# Patient Record
Sex: Male | Born: 1970 | Race: White | Hispanic: No | Marital: Married | Smoking: Never smoker
Health system: Southern US, Community
[De-identification: ages and names within clinical notes are randomized; demographics above are authoritative.]

## PROBLEM LIST (undated history)

## (undated) DIAGNOSIS — I10 Essential (primary) hypertension: Secondary | ICD-10-CM

## (undated) DIAGNOSIS — B019 Varicella without complication: Secondary | ICD-10-CM

## (undated) DIAGNOSIS — N059 Unspecified nephritic syndrome with unspecified morphologic changes: Secondary | ICD-10-CM

## (undated) DIAGNOSIS — T7840XA Allergy, unspecified, initial encounter: Secondary | ICD-10-CM

## (undated) DIAGNOSIS — G4733 Obstructive sleep apnea (adult) (pediatric): Secondary | ICD-10-CM

## (undated) DIAGNOSIS — E669 Obesity, unspecified: Secondary | ICD-10-CM

## (undated) HISTORY — DX: Obesity, unspecified: E66.9

## (undated) HISTORY — DX: Varicella without complication: B01.9

## (undated) HISTORY — PX: FINGER NAIL SURGERY: SHX717

## (undated) HISTORY — DX: Unspecified nephritic syndrome with unspecified morphologic changes: N05.9

## (undated) HISTORY — DX: Allergy, unspecified, initial encounter: T78.40XA

## (undated) HISTORY — DX: Essential (primary) hypertension: I10

## (undated) HISTORY — DX: Obstructive sleep apnea (adult) (pediatric): G47.33

---

## 2002-09-16 HISTORY — PX: TONSILLECTOMY AND ADENOIDECTOMY: SHX28

## 2003-09-14 ENCOUNTER — Other Ambulatory Visit: Payer: Self-pay

## 2004-10-13 ENCOUNTER — Emergency Department: Payer: Self-pay | Admitting: Unknown Physician Specialty

## 2005-09-16 HISTORY — PX: MEDIAL COLLATERAL LIGAMENT AND LATERAL COLLATERAL LIGAMENT REPAIR, KNEE: SHX2017

## 2005-12-22 ENCOUNTER — Emergency Department: Payer: Self-pay | Admitting: Emergency Medicine

## 2005-12-25 ENCOUNTER — Ambulatory Visit: Payer: Self-pay | Admitting: Orthopaedic Surgery

## 2006-03-11 ENCOUNTER — Other Ambulatory Visit: Payer: Self-pay

## 2006-03-17 ENCOUNTER — Ambulatory Visit: Payer: Self-pay | Admitting: Orthopaedic Surgery

## 2006-03-18 ENCOUNTER — Other Ambulatory Visit: Payer: Self-pay

## 2011-10-29 ENCOUNTER — Ambulatory Visit: Payer: Self-pay | Admitting: Otolaryngology

## 2011-11-15 ENCOUNTER — Ambulatory Visit: Payer: Self-pay | Admitting: Otolaryngology

## 2012-05-25 ENCOUNTER — Ambulatory Visit (INDEPENDENT_AMBULATORY_CARE_PROVIDER_SITE_OTHER): Payer: PRIVATE HEALTH INSURANCE | Admitting: Internal Medicine

## 2012-05-25 ENCOUNTER — Encounter: Payer: Self-pay | Admitting: Internal Medicine

## 2012-05-25 VITALS — BP 130/82 | HR 70 | Temp 98.1°F | Resp 18 | Ht 69.5 in | Wt 353.5 lb

## 2012-05-25 DIAGNOSIS — M25511 Pain in right shoulder: Secondary | ICD-10-CM

## 2012-05-25 DIAGNOSIS — M25519 Pain in unspecified shoulder: Secondary | ICD-10-CM

## 2012-05-25 DIAGNOSIS — G4733 Obstructive sleep apnea (adult) (pediatric): Secondary | ICD-10-CM

## 2012-05-25 DIAGNOSIS — G8929 Other chronic pain: Secondary | ICD-10-CM

## 2012-05-25 DIAGNOSIS — E669 Obesity, unspecified: Secondary | ICD-10-CM

## 2012-05-25 NOTE — Progress Notes (Signed)
Patient ID: Stanley Fernandez, male   DOB: 10-Nov-1970, 41 y.o.   MRN: [id number]  Patient Active Problem List  Diagnosis  . Sleep apnea, obstructive  . Obesity (BMI 30-39.9)  . Chronic right shoulder pain    Subjective:  CC:   Chief Complaint  Patient presents with  . Establish Care    HPI:   Stanley Fernandez is a 41 y.o. male who presents as a new patient to establish primary care with the chief complaint of weight gain.  Weighed 170 lbs in high school,  Has gained weight  Steadily since high school.  No history of diabetes .  2)  right shoulder/clavicular pain ,  Episodic, occurring for a few weeks at a time.  Aggravated by activities including softball.  No prior evaluation.  worse in the last 3 years,  Takes tylenol or alleve as needed .  Avoids NSAIDs due to history of Bright's disease as an adolescent.  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     Past Medical History  Diagnosis Date  . Chicken pox   . Bright's disease   . Hypertension   . Sleep apnea, obstructive     last study 2009  . Obesity (BMI 30-39.9)   . allergic rhinitis     managed with antihistamine    Past Surgical History  Procedure Date  . Medial collateral ligament and lateral collateral ligament repair, knee 2007  . Tonsillectomy and adenoidectomy 2004    Family History  Problem Relation Age of Onset  . Cancer Mother 81    mets to brain   . Heart disease Father 2    [facility]    History   Social History  . Marital Status: Married    Spouse Name: N/A    Number of Children: N/A  . Years of Education: N/A   Occupational History  . Not on file.   Social History Main Topics  . Smoking status: Never Smoker   . Smokeless tobacco: Former Neurosurgeon    Quit date: 05/25/2002  . Alcohol Use: Yes     rare  . Drug Use: No  . Sexually Active: Not on file   Other Topics Concern  . Not on file   Social History Narrative  . No narrative on file         @ALLHX @    Review of  Systems:   The remainder of the review of systems was negative except those addressed in the HPI.       Objective:  BP 130/82  Pulse 70  Temp 98.1 F (36.7 C) (Oral)  Resp 18  Ht 5' 9.5" (1.765 m)  Wt 353 lb 8 oz (160.347 kg)  BMI 51.45 kg/m2  SpO2 96%  General appearance: alert, cooperative and appears stated age Ears: normal TM's and external ear canals both ears Throat: lips, mucosa, and tongue normal; teeth and gums normal Neck: no adenopathy, no carotid bruit, supple, symmetrical, trachea midline and thyroid not enlarged, symmetric, no tenderness/mass/nodules Back: symmetric, no curvature. ROM normal. No CVA tenderness. Lungs: clear to auscultation bilaterally Heart: regular rate and rhythm, S1, S2 normal, no murmur, click, rub or gallop Abdomen: soft, non-tender; bowel sounds normal; no masses,  no organomegaly Pulses: 2+ and symmetric Skin: Skin color, texture, turgor normal. No rashes or lesions Lymph nodes: Cervical, supraclavicular, and axillary nodes normal.  Assessment and Plan:  Sleep apnea, obstructive Last sleep study was 4 yrs ago,  No symptoms  of inadequate treatment.  Will repeat study in  6 months after  Planned weight loss.   Obesity (BMI 30-39.9) I have addressed  BMI and recommended a low glycemic index diet utilizing smaller more frequent meals to increase metabolism.  I have also recommended that patient start exercising with a goal of 30 minutes of aerobic exercise a minimum of 5 days per week. Screening for lipid disorders, thyroid and diabetes to be done at his employers office.    Chronic right shoulder pain aggravated by overhead activities. Probable rotator cuff syndrome.  Currently asymptomatic.    Updated Medication List Outpatient Encounter Prescriptions as of 05/25/2012  Medication Sig Dispense Refill  . cetirizine (ZYRTEC) 10 MG tablet Take 10 mg by mouth daily.         No orders of the defined types were placed in this  encounter.    No Follow-up on file.

## 2012-05-25 NOTE — Assessment & Plan Note (Signed)
Last sleep study was 4 yrs ago,  No symptoms of inadequate treatment.  Will repeat study in  6 months after  Planned weight loss.

## 2012-05-25 NOTE — Assessment & Plan Note (Addendum)
I have addressed  BMI and recommended a low glycemic index diet utilizing smaller more frequent meals to increase metabolism.  I have also recommended that patient start exercising with a goal of 30 minutes of aerobic exercise a minimum of 5 days per week. Screening for lipid disorders, thyroid and diabetes to be done at his employers office.

## 2012-05-25 NOTE — Assessment & Plan Note (Signed)
aggravated by overhead activities. Probable rotator cuff syndrome.  Currently asymptomatic.

## 2012-05-25 NOTE — Patient Instructions (Addendum)
This is  Dr. Tullos's version of a  "Low GI"  Diet:  All of the foods can be found at grocery stores and in bulk at [employer].  The Atkins protein bars and shakes are available in more varieties at [employer], [employer] and [employer].     7 AM Breakfast:  Low carbohydrate Protein  Shakes (I recommend the EAS AdvantEdge "Carb Control" shakes  Or the low carb shakes by Atkins.   Both are available everywhere:  In  cases at [employer]Js  Or in 4 packs at grocery stores and pharmacies  2.5 carbs  (Alternative is  a toasted Arnold's Sandwhich Thin w/ peanut butter, a "Bagel Thin" with cream cheese and salmon) or  a scrambled egg burrito made with a low carb tortilla .  Avoid cereal and bananas, oatmeal too unless you are cooking the old fashioned kind that takes 30-40 minutes to prepare.  the rest is overly processed, has minimal fiber, and is loaded with carbohydrates!   10 AM: Protein bar by Atkins (the snack size, under 200 cal).  There are many varieties , available widely again or in bulk in limited varieties at BJs)  Other so called "protein bars" tend to be loaded with carbohydrates.  Remember, in food advertising, the word "energy" is synonymous for " carbohydrate."  Lunch: sandwich of turkey, (or any lunchmeat, grilled meat or canned tuna), fresh avocado, mayonnaise  and cheese on a lower carbohydrate pita bread, flatbread, or tortilla . Ok to use regular mayonnaise. The bread is the only source or carbohydrate that can be decreased (Joseph's makes a pita bread and a flat bread that are 50 cal and 4 net carbs ; Toufayan makes a low carb flatbread that's 100 cal and 9 net carbs  and  Mission makes a low carb whole wheat tortilla  That is 210 cal and 6 net carbs)  3 PM:  Mid day :  Another protein bar,  Or a  cheese stick (100 cal, 0 carbs),  Or 1 ounce of  almonds, walnuts, pistachios, pecans, peanuts,  Macadamia nuts. Or a Dannon light n Fit greek yogurt, 80 cal 8 net carbs . Avoid "granola"; the dried cranberries  and raisins are loaded with carbohydrates. Mixed nuts ok if no raisins or cranberries or dried fruit.      6 PM  Dinner:  "mean and green:"  Meat/chicken/fish or a high protein legume; , with a green salad, and a low GI  Veggie (broccoli, cauliflower, green beans, spinach, brussel sprouts. Lima beans) : Avoid "Low fat dressings, as well as [place] and [place]! They are loaded with sugar! Instead use ranch, vinagrette,  Blue cheese, etc  9 PM snack : Breyer's "low carb" fudgsicle or  ice cream bar (Carb Smart line), or  Weight Watcher's ice cream bar , or another "no sugar added" ice cream;a serving of fresh berries/cherries with whipped cream (Avoid bananas, pineapple, grapes  and watermelon on a regular basis because they are high in sugar)   Remember that snack Substitutions should be less than 15 to 20 carbs  Per serving. Remember to subtract fiber grams and sugar alcohols to get the "net carbs."    

## 2012-06-25 ENCOUNTER — Telehealth: Payer: Self-pay | Admitting: Internal Medicine

## 2012-06-25 NOTE — Telephone Encounter (Signed)
If he is not having fevers (T 100.4 or higher) ,  Or bloody diarrhea, he can take immodium for the diarrhea and follow a clear liquid diet until stools become formed.  Please review clear liquid diet with him :  Specifically No dairy,   Sodas ok.

## 2012-06-25 NOTE — Telephone Encounter (Signed)
Caller: Cory Roughen; Patient Name: Stanley Fernandez; PCP: Duncan Dull (Adults only); Best Callback Phone Number: [phone]; Reports onset 06/22/12 of vomiting and diarrhea, diarrhea continues, taking fluids, diarrhea x 4 since midnight. Guideline: Diarrhea, Disposition: Home care, due to others in home having similar virus, care advice offered, call back parameters reviewed.

## 2012-06-29 ENCOUNTER — Telehealth: Payer: Self-pay | Admitting: Internal Medicine

## 2012-06-29 NOTE — Telephone Encounter (Signed)
Mailed pt a copy of the low carb diet.

## 2012-06-29 NOTE — Telephone Encounter (Signed)
Pt came by and was wanting to know if he could get another print out of the Low Carb Diet plan that you gave him at his last visit if possible. Paitent says we can mail it ot him or call him to come piack it up.

## 2012-06-29 NOTE — Telephone Encounter (Signed)
The low GI diet is in a folder on my side of the nurses station,  Labelled  Dr. Norton Blizzard diet"

## 2012-06-29 NOTE — Telephone Encounter (Signed)
You do not need to get my permission to give  patient another copy of the low carb diet if I have given it to him. Our staff has copies printed in the back. He can either come by her you can mail it to him.

## 2012-06-29 NOTE — Telephone Encounter (Signed)
Spoke with patient via telephone, he stated that he is feeling better.  He wanted a copy of the diet Dr. Darrick Huntsman printed out for him last time.  Dr. Darrick Huntsman where are the copies kept?

## 2012-06-30 NOTE — Telephone Encounter (Signed)
Spoke with patient's wife and advised results letter mailed to patients home address with results.

## 2012-07-21 ENCOUNTER — Telehealth: Payer: Self-pay | Admitting: Internal Medicine

## 2012-07-21 NOTE — Telephone Encounter (Signed)
pls make appt prior to dec 31st to discuss results of his Healthy Living Screening if he wants to receive the incentive

## 2012-07-22 NOTE — Telephone Encounter (Signed)
Left message for pt to call office

## 2012-07-23 NOTE — Telephone Encounter (Signed)
Pt has appointment 11/18 blocked time below appointment to allow time to discuss this

## 2012-08-03 ENCOUNTER — Encounter: Payer: Self-pay | Admitting: Internal Medicine

## 2012-08-03 ENCOUNTER — Ambulatory Visit (INDEPENDENT_AMBULATORY_CARE_PROVIDER_SITE_OTHER): Payer: PRIVATE HEALTH INSURANCE | Admitting: Internal Medicine

## 2012-08-03 VITALS — BP 138/88 | HR 58 | Temp 98.1°F | Resp 12 | Ht 69.0 in | Wt 346.8 lb

## 2012-08-03 DIAGNOSIS — N390 Urinary tract infection, site not specified: Secondary | ICD-10-CM

## 2012-08-03 DIAGNOSIS — G4733 Obstructive sleep apnea (adult) (pediatric): Secondary | ICD-10-CM

## 2012-08-03 DIAGNOSIS — E669 Obesity, unspecified: Secondary | ICD-10-CM

## 2012-08-03 DIAGNOSIS — R030 Elevated blood-pressure reading, without diagnosis of hypertension: Secondary | ICD-10-CM

## 2012-08-03 DIAGNOSIS — I10 Essential (primary) hypertension: Secondary | ICD-10-CM

## 2012-08-03 LAB — POCT URINALYSIS DIPSTICK
Bilirubin, UA: NEGATIVE
Blood, UA: NEGATIVE
Glucose, UA: NEGATIVE
Ketones, UA: NEGATIVE
Leukocytes, UA: NEGATIVE
Nitrite, UA: NEGATIVE
Protein, UA: NEGATIVE
Spec Grav, UA: 1.025
Urobilinogen, UA: 0.2
pH, UA: 5.5

## 2012-08-03 NOTE — Patient Instructions (Addendum)
We are chekcing your urine for protein today.  If it is positive I will recommend that we start a medication for blood pressure  Return in 3 months  Try the low carb pasta as a substitute: Dreamfield's  Available at [employer]  Try the chicken piccata by Lamar Sprinkles,  It is very low carb , and can be cooked from a frozen state(BJs and [employer])

## 2012-08-03 NOTE — Assessment & Plan Note (Addendum)
I spent 15 minutes addressing his  BMI and recommended a low glycemic index diet utilizing smaller more frequent meals to increase his metabolism.  I have also recommended that patient start exercising with a goal of 30 minutes of aerobic exercise a minimum of 5 days per week. hgba1c is suggestive of pre diabetes

## 2012-08-03 NOTE — Progress Notes (Signed)
Patient ID: Stanley Fernandez, male   DOB: 1971-06-16, 41 y.o.   MRN: [id number]  Patient Active Problem List  Diagnosis  . Sleep apnea, obstructive  . Obesity (BMI 30-39.9)  . Chronic right shoulder pain  . Blood pressure elevated without history of HTN    Subjective:  CC:   Chief Complaint  Patient presents with  . Follow-up    HPI:   Stanley Fernandez a 41 y.o. male who presents for follow up on recent health screening labs done.  He has lost 5 lb since last visit but is not following the low glycemic index diet .  His labs indicated that he is at risk for developing diabetes and borderline hypertensive. He is not exercising,   Has no new issues.    Past Medical History  Diagnosis Date  . Chicken pox   . Bright's disease   . Hypertension   . Sleep apnea, obstructive     last study 2009  . Obesity (BMI 30-39.9)   . allergic rhinitis     managed with antihistamine    Past Surgical History  Procedure Date  . Medial collateral ligament and lateral collateral ligament repair, knee 2007  . Tonsillectomy and adenoidectomy 2004         The following portions of the patient's history were reviewed and updated as appropriate: Allergies, current medications, and problem list.    Review of Systems:   12 Pt  review of systems was negative except those addressed in the HPI,     History   Social History  . Marital Status: Married    Spouse Name: N/A    Number of Children: N/A  . Years of Education: N/A   Occupational History  . Not on file.   Social History Main Topics  . Smoking status: Never Smoker   . Smokeless tobacco: Former Neurosurgeon    Quit date: 05/25/2002  . Alcohol Use: Yes     Comment: rare  . Drug Use: No  . Sexually Active: Not on file   Other Topics Concern  . Not on file   Social History Narrative  . No narrative on file    Objective:  BP 138/88  Pulse 58  Temp 98.1 F (36.7 C) (Oral)  Resp 12  Ht 5\' 9"  (1.753 m)  Wt 346 lb 12 oz  (157.285 kg)  BMI 51.21 kg/m2  SpO2 96%  General appearance: alert, cooperative and appears stated age Ears: normal TM's and external ear canals both ears Throat: lips, mucosa, and tongue normal; teeth and gums normal Neck: no adenopathy, no carotid bruit, supple, symmetrical, trachea midline and thyroid not enlarged, symmetric, no tenderness/mass/nodules Back: symmetric, no curvature. ROM normal. No CVA tenderness. Lungs: clear to auscultation bilaterally Heart: regular rate and rhythm, S1, S2 normal, no murmur, click, rub or gallop Abdomen: soft, non-tender; bowel sounds normal; no masses,  no organomegaly Pulses: 2+ and symmetric Skin: Skin color, texture, turgor normal. No rashes or lesions Lymph nodes: Cervical, supraclavicular, and axillary nodes normal.  Assessment and Plan:  Obesity (BMI 30-39.9) I spent 15 minutes addressing his  BMI and recommended a low glycemic index diet utilizing smaller more frequent meals to increase his metabolism.  I have also recommended that patient start exercising with a goal of 30 minutes of aerobic exercise a minimum of 5 days per week. hgba1c is suggestive of pre diabetes   Sleep apnea, obstructive He has been experiencing some fatigue an elevated  Blood pressures which  may be a sign of inadequately treated OSA.   Discussed repeating sleep study in the near future for assessment of adequacy of current CPAP settings   Blood pressure elevated without history of HTN Checking urine for microalbuminuria and will initiate ACE Inhibitor therapy if indicated.    Updated Medication List Outpatient Encounter Prescriptions as of 08/03/2012  Medication Sig Dispense Refill  . cetirizine (ZYRTEC) 10 MG tablet Take 10 mg by mouth daily.         Orders Placed This Encounter  Procedures  . Urine Microalbumin w/creat. ratio  . POCT urinalysis dipstick    Return in about 3 months (around 11/03/2012).

## 2012-08-03 NOTE — Assessment & Plan Note (Signed)
Checking urine for microalbuminuria and will initiate ACE Inhibitor therapy if indicated.

## 2012-08-03 NOTE — Assessment & Plan Note (Addendum)
He has been experiencing some fatigue an elevated  Blood pressures which may be a sign of inadequately treated OSA.   Discussed repeating sleep study in the near future for assessment of adequacy of current CPAP settings

## 2012-08-05 LAB — MICROALBUMIN / CREATININE URINE RATIO
Creatinine,U: 172.3 mg/dL
Microalb Creat Ratio: 0.8 mg/g (ref 0.0–30.0)
Microalb, Ur: 1.3 mg/dL (ref 0.0–1.9)

## 2012-09-28 ENCOUNTER — Telehealth: Payer: Self-pay | Admitting: Internal Medicine

## 2012-09-28 ENCOUNTER — Encounter: Payer: Self-pay | Admitting: Internal Medicine

## 2012-09-28 ENCOUNTER — Ambulatory Visit (INDEPENDENT_AMBULATORY_CARE_PROVIDER_SITE_OTHER): Payer: PRIVATE HEALTH INSURANCE | Admitting: Internal Medicine

## 2012-09-28 VITALS — BP 138/84 | HR 97 | Temp 98.3°F | Resp 17 | Wt 343.8 lb

## 2012-09-28 DIAGNOSIS — J111 Influenza due to unidentified influenza virus with other respiratory manifestations: Secondary | ICD-10-CM

## 2012-09-28 DIAGNOSIS — R6889 Other general symptoms and signs: Secondary | ICD-10-CM

## 2012-09-28 LAB — POCT INFLUENZA A/B
Influenza A, POC: NEGATIVE
Influenza B, POC: NEGATIVE

## 2012-09-28 MED ORDER — BENZONATATE 200 MG PO CAPS: 200.0000 mg | ORAL_CAPSULE | Freq: Three times a day (TID) | ORAL | Status: DC | PRN

## 2012-09-28 MED ORDER — OSELTAMIVIR PHOSPHATE 75 MG PO CAPS: 75.0000 mg | ORAL_CAPSULE | Freq: Two times a day (BID) | ORAL | Status: DC

## 2012-09-28 MED ORDER — PREDNISONE (PAK) 10 MG PO TABS: ORAL_TABLET | ORAL | Status: DC

## 2012-09-28 MED ORDER — LEVOFLOXACIN 500 MG PO TABS: 500.0000 mg | ORAL_TABLET | Freq: Every day | ORAL | Status: DC

## 2012-09-28 NOTE — Telephone Encounter (Signed)
Patient Information:  Caller Name: [place]  Phone: [phone]  Patient: Stanley Fernandez, Stanley Fernandez  Gender: Male  DOB: Mar 10, 1971  Age: 42 Years  PCP: Duncan Dull (Adults only)  Office Follow Up:  Does the office need to follow up with this patient?: No  Instructions For The Office: N/A   Symptoms  Reason For Call & Symptoms: fever, chest congestion, abdominal pain and diarrhea  Reviewed Health History In EMR: Yes  Reviewed Medications In EMR: Yes  Reviewed Allergies In EMR: Yes  Reviewed Surgeries / Procedures: Yes  Date of Onset of Symptoms: 09/26/2012  Treatments Tried: Advil, Tylenol, decongestant, allergy pill  Treatments Tried Worked: No  Any Fever: Yes  Fever Taken: Oral  Fever Time Of Reading: 08:00:00  Fever Last Reading: 100  Guideline(s) Used:  Sinus Pain and Congestion  Disposition Per Guideline:   See Today or Tomorrow in Office  Reason For Disposition Reached:   Patient wants to be seen  Advice Given:  For a Stuffy Nose - Use Nasal Washes:  Introduction: Saline (salt water) nasal irrigation (nasal wash) is an effective and simple home remedy for treating stuffy nose and sinus congestion. The nose can be irrigated by pouring, spraying, or squirting salt water into the nose and then letting it run back out.  How it Helps: The salt water rinses out excess mucus, washes out any irritants (dust, allergens) that might be present, and moistens the nasal cavity.  Step-By-Step Instructions:   Step 1: Lean over a sink.  Step 2: Gently squirt or spray warm salt water into one of your nostrils.  Step 3: Some of the water may run into the back of your throat. Spit this out. If you swallow the salt water it will not hurt you.  Step 4: Blow your nose to clean out the water and mucus.  Hydration:  Drink plenty of liquids (6-8 glasses of water daily). If the air in your home is dry, use a cool mist humidifier  Call Back If:   You become worse.  Appointment Scheduled:  09/28/2012 14:00:00 Appointment Scheduled Provider:  Duncan Dull (Adults only)

## 2012-09-28 NOTE — Patient Instructions (Addendum)
I am treating you for influenza and an ear infection .  Please take the tamiflu twice daily starting today for the flu.  Let your children's pediatrician know that I am treating you   I am adding levaquin as an antibiotic for the ears and sinuses,.  Benzonatate capsules for the cough  Use sudafed PE 10 to 30 every 6 hours as needed for sinus and ear congestion.    Add the prednisone taper in a few days if the cough is persistent

## 2012-09-28 NOTE — Telephone Encounter (Signed)
Patient's wife called wanting patients flu results

## 2012-09-28 NOTE — Progress Notes (Signed)
Patient ID: Stanley Fernandez, male   DOB: 05-18-1971, 42 y.o.   MRN: [id number]   Patient Active Problem List  Diagnosis  . Sleep apnea, obstructive  . Obesity (BMI 30-39.9)  . Chronic right shoulder pain  . Blood pressure elevated without history of HTN  . Influenza-like illness    Subjective:  CC:   Chief Complaint  Patient presents with  . Cough    productive, green phlegm  . Headache    HPI:   Stanley Fernandez a 42 y.o. male who presentsInfluenza like illness. Symptoms started on Saturday with sinus congestion,  Followed by productive cough with purulent sputum, now with headache sore throat, myalgias, fevers and chills.   Temp was  99 took advil  Tylenol and decongestant.  No history of flu shot .Marland Kitchen     Past Medical History  Diagnosis Date  . Chicken pox   . Bright's disease   . Hypertension   . Sleep apnea, obstructive     last study 2009  . Obesity (BMI 30-39.9)   . allergic rhinitis     managed with antihistamine    Past Surgical History  Procedure Date  . Medial collateral ligament and lateral collateral ligament repair, knee 2007  . Tonsillectomy and adenoidectomy 2004         The following portions of the patient's history were reviewed and updated as appropriate: Allergies, current medications, and problem list.    Review of Systems:   Patient denies  skin rash, eye pain, , dysphagia,  hemoptysis ,wheezing, chest pain, palpitations, orthopnea, edema, abdominal pain, nausea, melena, diarrhea, constipation, flank pain, dysuria, hematuria, urinary  Frequency, nocturia, numbness, tingling, seizures,  Focal weakness, Loss of consciousness,  Tremor, insomnia, depression, anxiety, and suicidal ideation.      History   Social History  . Marital Status: Married    Spouse Name: N/A    Number of Children: N/A  . Years of Education: N/A   Occupational History  . Not on file.   Social History Main Topics  . Smoking status: Never Smoker   . Smokeless  tobacco: Former Neurosurgeon    Quit date: 05/25/2002  . Alcohol Use: Yes     Comment: rare  . Drug Use: No  . Sexually Active: Not on file   Other Topics Concern  . Not on file   Social History Narrative  . No narrative on file    Objective:  BP 138/84  Pulse 97  Temp 98.3 F (36.8 C) (Oral)  Resp 17  Wt 343 lb 12 oz (155.924 kg)  SpO2 98%  General appearance: morbidly obese, sick appearing,  diaphoretic, cooperative and appears stated age Ears: normal TM's and external ear canals both ears Throat: lips, mucosa, and tongue normal; teeth and gums normal. Tonsillar erythema without exudate  Neck: no adenopathy, no carotid bruit, supple, symmetrical, trachea midline and thyroid not enlarged, symmetric, no tenderness/mass/nodules Back: symmetric, no curvature. ROM normal. No CVA tenderness. Lungs: decreased BS bilaterally without wheezing  Heart: regular rate and rhythm, S1, S2 normal, no murmur, click, rub or gallop Abdomen: soft, non-tender; bowel sounds normal; no masses,  no organomegaly Pulses: 2+ and symmetric Skin: Skin color, texture, turgor normal. No rashes or lesions Lymph nodes: Cervical, supraclavicular, and axillary nodes normal.  Assessment and Plan:  Influenza-like illness Given recent onset of symptoms  Will treat for flue and add levaquin for sinusitis and otitis. Add prednisone if cough does not improve in a few days.  Updated Medication List Outpatient Encounter Prescriptions as of 09/28/2012  Medication Sig Dispense Refill  . cetirizine (ZYRTEC) 10 MG tablet Take 10 mg by mouth daily.      . benzonatate (TESSALON) 200 MG capsule Take 1 capsule (200 mg total) by mouth 3 (three) times daily as needed for cough.  60 capsule  0  . levofloxacin (LEVAQUIN) 500 MG tablet Take 1 tablet (500 mg total) by mouth daily.  7 tablet  0  . oseltamivir (TAMIFLU) 75 MG capsule Take 1 capsule (75 mg total) by mouth 2 (two) times daily.  10 capsule  0  . predniSONE (STERAPRED  UNI-PAK) 10 MG tablet 6 tablets on Day 1 , then reduce by 1 tablet daily until gone  21 tablet  0     Orders Placed This Encounter  Procedures  . POCT Influenza A/B    No Follow-up on file.

## 2012-09-28 NOTE — Assessment & Plan Note (Signed)
Given recent onset of symptoms  Will treat for flue and add levaquin for sinusitis and otitis. Add prednisone if cough does not improve in a few days.

## 2012-09-29 NOTE — Telephone Encounter (Signed)
Pt notified Flu results were negative.

## 2012-10-31 ENCOUNTER — Other Ambulatory Visit: Payer: Self-pay

## 2012-11-03 ENCOUNTER — Ambulatory Visit: Payer: PRIVATE HEALTH INSURANCE | Admitting: Internal Medicine

## 2012-11-17 ENCOUNTER — Ambulatory Visit: Payer: PRIVATE HEALTH INSURANCE | Admitting: Internal Medicine

## 2013-01-21 ENCOUNTER — Encounter: Payer: Self-pay | Admitting: Internal Medicine

## 2013-01-21 ENCOUNTER — Ambulatory Visit (INDEPENDENT_AMBULATORY_CARE_PROVIDER_SITE_OTHER): Payer: PRIVATE HEALTH INSURANCE | Admitting: Internal Medicine

## 2013-01-21 VITALS — BP 138/78 | HR 65 | Temp 97.8°F | Resp 18 | Wt 351.5 lb

## 2013-01-21 DIAGNOSIS — R030 Elevated blood-pressure reading, without diagnosis of hypertension: Secondary | ICD-10-CM

## 2013-01-21 DIAGNOSIS — R002 Palpitations: Secondary | ICD-10-CM

## 2013-01-21 DIAGNOSIS — R739 Hyperglycemia, unspecified: Secondary | ICD-10-CM

## 2013-01-21 DIAGNOSIS — G4733 Obstructive sleep apnea (adult) (pediatric): Secondary | ICD-10-CM

## 2013-01-21 DIAGNOSIS — R7309 Other abnormal glucose: Secondary | ICD-10-CM

## 2013-01-21 DIAGNOSIS — J309 Allergic rhinitis, unspecified: Secondary | ICD-10-CM

## 2013-01-21 LAB — COMPREHENSIVE METABOLIC PANEL
ALT: 38 U/L (ref 0–53)
AST: 23 U/L (ref 0–37)
Albumin: 3.8 g/dL (ref 3.5–5.2)
Alkaline Phosphatase: 58 U/L (ref 39–117)
BUN: 10 mg/dL (ref 6–23)
CO2: 28 mEq/L (ref 19–32)
Calcium: 9 mg/dL (ref 8.4–10.5)
Chloride: 104 mEq/L (ref 96–112)
Creatinine, Ser: 0.9 mg/dL (ref 0.4–1.5)
GFR: 94.25 mL/min (ref >60.00)
Glucose, Bld: 140 mg/dL — ABNORMAL HIGH (ref 70–99)
Potassium: 4.2 mEq/L (ref 3.5–5.1)
Sodium: 139 mEq/L (ref 135–145)
Total Bilirubin: 0.6 mg/dL (ref 0.3–1.2)
Total Protein: 6.4 g/dL (ref 6.0–8.3)

## 2013-01-21 LAB — HEMOGLOBIN A1C: Hgb A1c MFr Bld: 6.1 % (ref 4.6–6.5)

## 2013-01-21 LAB — MAGNESIUM: Magnesium: 1.8 mg/dL (ref 1.5–2.5)

## 2013-01-21 LAB — TSH: TSH: 1.35 u[IU]/mL (ref 0.35–5.50)

## 2013-01-21 LAB — LDL CHOLESTEROL, DIRECT: Direct LDL: 108 mg/dL

## 2013-01-21 NOTE — Progress Notes (Signed)
Patient ID: Stanley Fernandez, male   DOB: April 30, 1971, 42 y.o.   MRN: [id number]   Patient Active Problem List   Diagnosis Date Noted  . Allergic rhinitis 01/23/2013  . Blood pressure elevated without history of HTN 08/03/2012  . Chronic right shoulder pain 05/25/2012  . Sleep apnea, obstructive   . Morbid obesity     Subjective:  CC:   Chief Complaint  Patient presents with  . Follow-up    HPI:   Stanley Fernandez a 42 y.o. male who presents for follow up on hypertension, OSA and obesity.      Feels pretty good except for persistnet sneezing and congestion due to pollen seasson.  Using zyrtec daily. Marland Kitchen He has recovered fully from his influenza type illness in January.  He is wearing his  CPAP every night all night.  His mask not leaking but feels cumbersome;  He still feels that hie sleeps better wearing it than not wearing it.  Titration study to check mask was preempted by inclement weather and has been rescheduled.    Has been inconsistent with low glycemic diet due to hunger, despite eating 6 times daily.  Has been distracted by work schedule due to [employer]" season and increased demands for time.  He actually  lost weight to a nadir of 335 lbs , partly due to a GI illness, after which he noticed  Frequent palpitations after the GI illness  That occurred throughout the day,  Intermittently,  Unaccompanied by chest pain or dyspnea..  The epiosdes occurred intermittently  for  several weeks before resolving.  Hasn't had it for two weeks.  Has gained 8 lbs since last visit, his  bmi is now 50.   Labs done in oct 2013 indicate risk for developing diabetes,  So an a1c was done and was 5.8 .    Past Medical History  Diagnosis Date  . Chicken pox   . Bright's disease   . Hypertension   . Sleep apnea, obstructive     last study 2009  . Obesity (BMI 30-39.9)   . allergic rhinitis     managed with antihistamine    Past Surgical History  Procedure Laterality Date  . Medial collateral  ligament and lateral collateral ligament repair, knee  2007  . Tonsillectomy and adenoidectomy  2004       The following portions of the patient's history were reviewed and updated as appropriate: Allergies, current medications, and problem list.    Review of Systems:   Patient denies headache, fevers, malaise, unintentional weight loss, skin rash, eye pain, sinus congestion and sinus pain, sore throat, dysphagia,  hemoptysis , cough, dyspnea, wheezing, chest pain, palpitations, orthopnea, edema, abdominal pain, nausea, melena, diarrhea, constipation, flank pain, dysuria, hematuria, urinary  Frequency, nocturia, numbness, tingling, seizures,  Focal weakness, Loss of consciousness,  Tremor, insomnia, depression, anxiety, and suicidal ideation.     History   Social History  . Marital Status: Married    Spouse Name: N/A    Number of Children: N/A  . Years of Education: N/A   Occupational History  . Not on file.   Social History Main Topics  . Smoking status: Never Smoker   . Smokeless tobacco: Former Neurosurgeon    Quit date: 05/25/2002  . Alcohol Use: Yes     Comment: rare  . Drug Use: No  . Sexually Active: Not on file   Other Topics Concern  . Not on file   Social History Narrative  .  No narrative on file    Objective:  BP 138/78  Pulse 65  Temp(Src) 97.8 F (36.6 C) (Oral)  Resp 18  Wt 351 lb 8 oz (159.439 kg)  BMI 51.88 kg/m2  SpO2 98%  General appearance: alert, cooperative and appears stated age Ears: normal TM's and external ear canals both ears Throat: lips, mucosa, and tongue normal; teeth and gums normal Neck: no adenopathy, no carotid bruit, supple, symmetrical, trachea midline and thyroid not enlarged, symmetric, no tenderness/mass/nodules Back: symmetric, no curvature. ROM normal. No CVA tenderness. Lungs: clear to auscultation bilaterally Heart: regular rate and rhythm, S1, S2 normal, no murmur, click, rub or gallop Abdomen: soft, non-tender; bowel  sounds normal; no masses,  no organomegaly Pulses: 2+ and symmetric Skin: Skin color, texture, turgor normal. No rashes or lesions Lymph nodes: Cervical, supraclavicular, and axillary nodes normal.  Assessment and Plan:  Sleep apnea, obstructive Repeat titration study rescheduled .  Patient is compliant with nightly use and is sleeping better.   Morbid obesity BMI is now 50, and he has concurrent hypertension, OSA and impaired fasting glucose, with an A1c 6.1    I have addressed  BMI and recommended resuming  a low glycemic index diet utilizing smaller more frequent meals to increase metabolism or a comparable diet plan that he is more comfortable with .  I have also recommended that patient start exercising with a goal of 30 minutes of aerobic exercise a minimum of 5 days per week. If he has not lost any weight at 3 month follow up will refer to bariatric center for comprehensive program of weight loss vs gastric bypass  Blood pressure elevated without history of HTN Borderline, with no microalbuminuria by November evaluation.  Will follow for now.   Allergic rhinitis Prn afrin for nighttiem congestion,  Add nasonex and saline flushes twice daily during pollen season to zyrtec.    Updated Medication List Outpatient Encounter Prescriptions as of 01/21/2013  Medication Sig Dispense Refill  . cetirizine (ZYRTEC) 10 MG tablet Take 10 mg by mouth daily.      . mometasone (NASONEX) 50 MCG/ACT nasal spray Place 4 sprays into the nose daily.  17 g  12  . [DISCONTINUED] benzonatate (TESSALON) 200 MG capsule Take 1 capsule (200 mg total) by mouth 3 (three) times daily as needed for cough.  60 capsule  0  . [DISCONTINUED] levofloxacin (LEVAQUIN) 500 MG tablet Take 1 tablet (500 mg total) by mouth daily.  7 tablet  0  . [DISCONTINUED] oseltamivir (TAMIFLU) 75 MG capsule Take 1 capsule (75 mg total) by mouth 2 (two) times daily.  10 capsule  0  . [DISCONTINUED] predniSONE (STERAPRED UNI-PAK) 10 MG  tablet 6 tablets on Day 1 , then reduce by 1 tablet daily until gone  21 tablet  0   No facility-administered encounter medications on file as of 01/21/2013.     Orders Placed This Encounter  Procedures  . Hemoglobin A1c  . Comprehensive metabolic panel  . TSH  . Magnesium  . LDL cholesterol, direct    Return in about 3 months (around 04/23/2013).

## 2013-01-21 NOTE — Patient Instructions (Addendum)
Adding the steroid nasal spray daily to your zyrtec.  Ok to add afrin just at bedtime or twice daily for max 5 days. Flush your sinues when you come in from outside   This is  One version of a  "Low GI"  Diet:  It will still lower your blood sugars and allow you to lose 4 to 8  lbs  per month if you follow it carefully.  Your goal with exercise is a minimum of 30 minutes of aerobic exercise 5 days per week (Walking does not count once it becomes easy!)    All of the foods can be found at grocery stores and in bulk at [employer].  The Atkins protein bars and shakes are available in more varieties at Target, WalMart and [employer]'s Foods.     7 AM Breakfast:  Choose from the following:  Low carbohydrate Protein  Shakes (I recommend the EAS AdvantEdge "Carb Control" shakes  Or the low carb shakes by Atkins.    2.5 carbs   Arnold's "Sandwhich Thin"toasted  w/ peanut butter (no jelly: about 20 net carbs  "Bagel Thin" with cream cheese and salmon: about 20 carbs   a scrambled egg/bacon/cheese burrito made with Mission's "carb balance" whole wheat tortilla  (about 10 net carbs )  Try the microwaveable Jimmy dean [place] sausage on a low carb wrap ([place] tortilla wrap is low carb )    Avoid cereal and bananas, oatmeal and cream of wheat and grits. They are loaded with carbohydrates!   10 AM: high protein snack  Protein bar by Atkins (the snack size, under 200 cal, usually < 6 net carbs).    A stick of cheese:  Around 1 carb,  100 cal     Dannon Light n Fit [place] Yogurt  (80 cal, 8 carbs)  Other so called "protein bars" and Greek yogurts tend to be loaded with carbohydrates.  Remember, in food advertising, the word "energy" is synonymous for " carbohydrate."  Lunch:   A Sandwich using the bread choices listed, Can use any  Eggs,  lunchmeat, grilled meat or canned tuna), avocado, regular mayo/mustard  and cheese.  A Salad using blue cheese, ranch,  Goddess or vinagrette,  No croutons or "confetti"  and no "candied nuts" but regular nuts OK.   No pretzels or chips.  Pickles and miniature sweet peppers are a good low carb alternative that provide a "crunch"  The bread is the only source of carbohydrate in a sandwich and  can be decreased by trying some of these alternatives to traditional loaf bread  Joseph's makes a pita bread and a flat bread that are 50 cal and 4 net carbs available at BJs and WalMart.  This can be toasted to use with hummous as well  Toufayan makes a low carb flatbread that's 100 cal and 9 net carbs available at [employer] and [employer] makes 2 sizes of  Low carb whole wheat tortilla  (The large one is 210 cal and 6 net carbs) Avoid "Low fat dressings, as well as Reyne Dumas and [address] dressings They are loaded with sugar!   3 PM/ Mid day  Snack:  Consider  1 ounce of  almonds, walnuts, pistachios, pecans, peanuts,  Macadamia nuts or a nut medley.  Avoid "granola"; the dried cranberries and raisins are loaded with carbohydrates. Mixed nuts as long as there are no raisins,  cranberries or dried fruit.     6 PM  Dinner:  Meat/fowl/fish with a green salad, and either broccoli, cauliflower, green beans, spinach, brussel sprouts or  Lima beans. DO NOT BREAD THE PROTEIN!!      There is a low carb pasta by Dreamfield's that is acceptable and tastes great: only 5 digestible carbs/serving.( All grocery stores but BJs carry it )  Try Kai Levins Angelo's chicken piccata or chicken or eggplant parm over low carb pasta.(Lowes and BJs)   Clifton Custard Sanchez's "Carnitas" (pulled pork, no sauce,  0 carbs) or his beef pot roast to make a dinner burrito (at BJ's)  Pesto over low carb pasta (bj's sells a good quality pesto in the center refrigerated section of the deli   Whole wheat pasta is still full of digestible carbs and  Not as low in glycemic index as Dreamfield's.   Brown rice is still rice,  So skip the rice and noodles if you eat [place] or [place] (or at least limit to 1/2  cup)  9 PM snack :   Breyer's "low carb" fudgsicle or  ice cream bar (Carb Smart line), or  Weight Watcher's ice cream bar , or another "no sugar added" ice cream;  a serving of fresh berries/cherries with whipped cream   Cheese or DANNON'S LlGHT N FIT [place] YOGURT  Avoid bananas, pineapple, grapes  and watermelon on a regular basis because they are high in sugar.  THINK OF THEM AS DESSERT  Remember that snack Substitutions should be less than 10 NET carbs per serving and meals < 20 carbs. Remember to subtract fiber grams to get the "net carbs."

## 2013-01-22 ENCOUNTER — Encounter: Payer: Self-pay | Admitting: Internal Medicine

## 2013-01-23 ENCOUNTER — Encounter: Payer: Self-pay | Admitting: Internal Medicine

## 2013-01-23 DIAGNOSIS — J309 Allergic rhinitis, unspecified: Secondary | ICD-10-CM | POA: Insufficient documentation

## 2013-01-23 MED ORDER — MOMETASONE FUROATE 50 MCG/ACT NA SUSP: 4.0000 | Freq: Every day | NASAL | Status: DC

## 2013-01-23 NOTE — Assessment & Plan Note (Addendum)
BMI is now 50, and he has concurrent hypertension, OSA and impaired fasting glucose, with an A1c 6.1    I have addressed  BMI and recommended resuming  a low glycemic index diet utilizing smaller more frequent meals to increase metabolism or a comparable diet plan that he is more comfortable with .  I have also recommended that patient start exercising with a goal of 30 minutes of aerobic exercise a minimum of 5 days per week. If he has not lost any weight at 3 month follow up will refer to bariatric center for comprehensive program of weight loss vs gastric bypass

## 2013-01-23 NOTE — Assessment & Plan Note (Signed)
Repeat titration study rescheduled .  Patient is compliant with nightly use and is sleeping better.

## 2013-01-23 NOTE — Assessment & Plan Note (Signed)
Borderline, with no microalbuminuria by November evaluation.  Will follow for now.

## 2013-01-23 NOTE — Assessment & Plan Note (Addendum)
Prn afrin for nighttiem congestion,  Add nasonex and saline flushes twice daily during pollen season to zyrtec.

## 2013-01-25 ENCOUNTER — Telehealth: Payer: Self-pay

## 2013-01-25 NOTE — Telephone Encounter (Signed)
MyChart Message: Your electrlyotes are all normal, But your A1c has increased from 5.8 to 6.1 since last time we checked. This is suggesting you are very close to developing diabetes. I recommend the low GI diet and regular daily exercise ASAP and follow up with me in 3 months for repeat labs and evaluation. You can lose up to 8 lbs per month on the diet I gave you if you stick with it.   Notified patient of his results above.Marland Kitchen

## 2013-07-22 ENCOUNTER — Other Ambulatory Visit: Payer: Self-pay

## 2013-08-23 ENCOUNTER — Encounter: Payer: Self-pay | Admitting: Internal Medicine

## 2013-08-23 ENCOUNTER — Ambulatory Visit (INDEPENDENT_AMBULATORY_CARE_PROVIDER_SITE_OTHER): Payer: PRIVATE HEALTH INSURANCE | Admitting: Internal Medicine

## 2013-08-23 VITALS — BP 138/78 | HR 71 | Temp 98.5°F | Resp 14 | Ht 69.0 in | Wt 356.0 lb

## 2013-08-23 DIAGNOSIS — R0600 Dyspnea, unspecified: Secondary | ICD-10-CM

## 2013-08-23 DIAGNOSIS — R030 Elevated blood-pressure reading, without diagnosis of hypertension: Secondary | ICD-10-CM

## 2013-08-23 DIAGNOSIS — Z23 Encounter for immunization: Secondary | ICD-10-CM

## 2013-08-23 DIAGNOSIS — G4733 Obstructive sleep apnea (adult) (pediatric): Secondary | ICD-10-CM

## 2013-08-23 DIAGNOSIS — R0609 Other forms of dyspnea: Secondary | ICD-10-CM

## 2013-08-23 DIAGNOSIS — R0989 Other specified symptoms and signs involving the circulatory and respiratory systems: Secondary | ICD-10-CM

## 2013-08-23 MED ORDER — MOMETASONE FUROATE 50 MCG/ACT NA SUSP: 4.0000 | Freq: Every day | NASAL | Status: DC

## 2013-08-23 NOTE — Patient Instructions (Signed)
   Take benadryl 25 mg and delsym cough syrup 1 hour before beditme for post nasal drip and cough   Referral to cardiology pre exercise

## 2013-08-23 NOTE — Assessment & Plan Note (Addendum)
Repeat study done due to prior inability to wear./tolerate mask,  Now tolerating mask ,  Wearing it an average of 6.5 hours .  Recent study was not forwarded to me.

## 2013-08-24 NOTE — Assessment & Plan Note (Addendum)
Pressure remain borderline. No proteinura and normal renal function. Treating OSA> No treatment started except wt loss recommended.

## 2013-08-24 NOTE — Progress Notes (Signed)
Patient ID: Stanley Fernandez, male   DOB: 1971-02-27, 42 y.o.   MRN: [id number]  Patient Active Problem List   Diagnosis Date Noted  . Allergic rhinitis 01/23/2013  . Blood pressure elevated without history of HTN 08/03/2012  . Chronic right shoulder pain 05/25/2012  . Sleep apnea, obstructive   . Morbid obesity     Subjective:  CC:   Chief Complaint  Patient presents with  . Follow-up    Paper work for insurance    HPI:   Stanley Fernandez a 42 y.o. male who presents Follow up on chronic health issues including morbid obvesity., borderline hypertension, hyperlipidemia and OSA> Has been inconsistent with attempts to lose weight . Weight has been labile but improves whenever he follows a low glycemin index diet.   Afraid to exercise regularly bc he is concerned he will drop dead on the track . Has had bariatric referral in the past but due to his inconsistent attempts at weight loss and dietary habits was not offered surgery.   Had a health screen in October mandated by work and here to review results today.      Past Medical History  Diagnosis Date  . Chicken pox   . Bright's disease   . Hypertension   . Sleep apnea, obstructive     last study 2009  . Obesity (BMI 30-39.9)   . allergic rhinitis     managed with antihistamine    Past Surgical History  Procedure Laterality Date  . Medial collateral ligament and lateral collateral ligament repair, knee  2007  . Tonsillectomy and adenoidectomy  2004       The following portions of the patient's history were reviewed and updated as appropriate: Allergies, current medications, and problem list.    Review of Systems:   Patient denies headache, fevers, malaise, unintentional weight loss, skin rash, eye pain, sinus congestion and sinus pain, sore throat, dysphagia,  hemoptysis , cough, dyspnea, wheezing, chest pain, palpitations, orthopnea, edema, abdominal pain, nausea, melena, diarrhea, constipation, flank pain, dysuria,  hematuria, urinary  Frequency, nocturia, numbness, tingling, seizures,  Focal weakness, Loss of consciousness,  Tremor, insomnia, depression, anxiety, and suicidal ideation.     History   Social History  . Marital Status: Married    Spouse Name: N/A    Number of Children: N/A  . Years of Education: N/A   Occupational History  . Not on file.   Social History Main Topics  . Smoking status: Never Smoker   . Smokeless tobacco: Former Neurosurgeon    Quit date: 05/25/2002  . Alcohol Use: Yes     Comment: rare  . Drug Use: No  . Sexual Activity: Not on file   Other Topics Concern  . Not on file   Social History Narrative  . No narrative on file    Objective:  Filed Vitals:   08/23/13 1621  BP: 138/78  Pulse: 71  Temp: 98.5 F (36.9 C)  Resp: 14     General appearance: alert, cooperative and appears stated age Ears: normal TM's and external ear canals both ears Throat: lips, mucosa, and tongue normal; teeth and gums normal Neck: no adenopathy, no carotid bruit, supple, symmetrical, trachea midline and thyroid not enlarged, symmetric, no tenderness/mass/nodules Back: symmetric, no curvature. ROM normal. No CVA tenderness. Lungs: clear to auscultation bilaterally Heart: regular rate and rhythm, S1, S2 normal, no murmur, click, rub or gallop Abdomen: soft, non-tender; bowel sounds normal; no masses,  no organomegaly Pulses: 2+ and  symmetric Skin: Skin color, texture, turgor normal. No rashes or lesions Lymph nodes: Cervical, supraclavicular, and axillary nodes normal.  Assessment and Plan:  Sleep apnea, obstructive Repeat study done due to prior inability to wear./tolerate mask,  Now tolerating mask ,  Wearing it an average of 6.5 hours .  Recent study was not forwarded to me.   Morbid obesity Long discussion with patient today about weight, diet,  Exercise.  Needs referral to cardiology to give him some assurance that he can begin an exercise program. Low GI diet again  discussed,  He knows what to do but lacks consistency in efforts.   Blood pressure elevated without history of HTN Pressure remain borderline. No proteinura and normal renal function. Treating OSA> No treatment started except wt loss recommended.   A total of 40 minutes was spent with patient more than half of which was spent in counseling, reviewing records from other prviders and coordination of care.  Updated Medication List Outpatient Encounter Prescriptions as of 08/23/2013  Medication Sig  . cetirizine (ZYRTEC) 10 MG tablet Take 10 mg by mouth daily.  . mometasone (NASONEX) 50 MCG/ACT nasal spray Place 4 sprays into the nose daily.  . [DISCONTINUED] mometasone (NASONEX) 50 MCG/ACT nasal spray Place 4 sprays into the nose daily.     Orders Placed This Encounter  Procedures  . Ambulatory referral to Cardiology    No Follow-up on file.

## 2013-08-24 NOTE — Assessment & Plan Note (Signed)
Long discussion with patient today about weight, diet,  Exercise.  Needs referral to cardiology to give him some assurance that he can begin an exercise program. Low GI diet again discussed,  He knows what to do but lacks consistency in efforts.

## 2013-08-30 ENCOUNTER — Ambulatory Visit: Payer: PRIVATE HEALTH INSURANCE | Admitting: Cardiovascular Disease

## 2013-09-28 ENCOUNTER — Ambulatory Visit (INDEPENDENT_AMBULATORY_CARE_PROVIDER_SITE_OTHER): Payer: PRIVATE HEALTH INSURANCE | Admitting: Cardiovascular Disease

## 2013-09-28 ENCOUNTER — Encounter: Payer: Self-pay | Admitting: Cardiovascular Disease

## 2013-09-28 VITALS — BP 127/53 | HR 67 | Ht 69.0 in | Wt 359.5 lb

## 2013-09-28 VITALS — BP 127/53 | Ht 69.0 in | Wt 359.0 lb

## 2013-09-28 DIAGNOSIS — R000 Tachycardia, unspecified: Secondary | ICD-10-CM

## 2013-09-28 DIAGNOSIS — R0609 Other forms of dyspnea: Secondary | ICD-10-CM

## 2013-09-28 DIAGNOSIS — R0600 Dyspnea, unspecified: Secondary | ICD-10-CM

## 2013-09-28 DIAGNOSIS — R0989 Other specified symptoms and signs involving the circulatory and respiratory systems: Secondary | ICD-10-CM

## 2013-09-28 DIAGNOSIS — R0789 Other chest pain: Secondary | ICD-10-CM

## 2013-09-28 NOTE — Procedures (Signed)
   Treadmill Stress test  Indication: Exertional dyspnea.  Baseline Data:  Resting EKG shows NSR with rate of 82 bpm, no significant ST or T wave changes. Resting blood pressure of 140/72 mm Hg Stand bruce protocal was used.  Exercise Data:  Patient exercised for 6 min 0 sec,  Peak heart rate of 150 bpm.  This was 85 % of the maximum predicted heart rate. No symptoms of chest pain or lightheadedness were reported at peak stress or in recovery.  Peak Blood pressure recorded was 190/82 Maximal work level: 7 METs.  Heart rate at 3 minutes in recovery was 101 bpm. BP response: Hypertensive HR response: Normal  EKG with Exercise: Sinus tachycardia with no significant ST changes.  FINAL IMPRESSION: Normal exercise stress test. No significant EKG changes concerning for ischemia. Below average exercise tolerance for age.  Recommendation: No contraindication for starting an exercise program.

## 2013-09-28 NOTE — Patient Instructions (Signed)
Your stress test is normal.  Follow up as needed.  

## 2013-09-28 NOTE — Progress Notes (Signed)
Primary care physician: Dr. Darrick Huntsman.   HPI  This is a 43 year old male who was referred for evaluation of exertional dyspnea as he is planning to start an exercise program. He has no previous cardiac history. He has chronic medical conditions that include morbid obesity, borderline hypertension, hyperlipidemia and obstructive sleep apnea. He reports prolonged history of exertional dyspnea without chest discomfort. He denies smoking. There is no orthopnea or PND. No significant lower extremity edema. He does have family history of coronary artery disease as his father had myocardial infarction in his 70s. He is in Futures trader.  No Known Allergies   Current Outpatient Prescriptions on File Prior to Visit  Medication Sig Dispense Refill  . cetirizine (ZYRTEC) 10 MG tablet Take 10 mg by mouth daily.       No current facility-administered medications on file prior to visit.     Past Medical History  Diagnosis Date  . Chicken pox   . Hypertension   . Sleep apnea, obstructive     last study 2009  . Obesity (BMI 30-39.9)   . allergic rhinitis     managed with antihistamine  . Bright's disease      Past Surgical History  Procedure Laterality Date  . Medial collateral ligament and lateral collateral ligament repair, knee  2007  . Tonsillectomy and adenoidectomy  2004  . Finger nail surgery       Family History  Problem Relation Age of Onset  . Cancer Mother 50    breast, mets to brain   . Heart disease Father 6    AMI  . Heart attack Father      History   Social History  . Marital Status: Married    Spouse Name: N/A    Number of Children: N/A  . Years of Education: N/A   Occupational History  . Not on file.   Social History Main Topics  . Smoking status: Never Smoker   . Smokeless tobacco: Former Neurosurgeon    Quit date: 05/25/2002  . Alcohol Use: Yes     Comment: rare  . Drug Use: No  . Sexual Activity: Not on file   Other Topics Concern  . Not on file    Social History Narrative  . No narrative on file     ROS A 10 point review of system was performed. It is negative other than that mentioned in the history of present illness.   PHYSICAL EXAM   BP 127/53  Pulse 67  Ht 5\' 9"  (1.753 m)  Wt 359 lb 8 oz (163.068 kg)  BMI 53.06 kg/m2 Constitutional: He is oriented to person, place, and time. He appears well-developed and well-nourished. No distress.  HENT: No nasal discharge.  Head: Normocephalic and atraumatic.  Eyes: Pupils are equal and round.  No discharge. Neck: Normal range of motion. Neck supple. No JVD present. No thyromegaly present.  Cardiovascular: Normal rate, regular rhythm, normal heart sounds. Exam reveals no gallop and no friction rub. No murmur heard.  Pulmonary/Chest: Effort normal and breath sounds normal. No stridor. No respiratory distress. He has no wheezes. He has no rales. He exhibits no tenderness.  Abdominal: Soft. Bowel sounds are normal. He exhibits no distension. There is no tenderness. There is no rebound and no guarding.  Musculoskeletal: Normal range of motion. He exhibits no edema and no tenderness.  Neurological: He is alert and oriented to person, place, and time. Coordination normal.  Skin: Skin is warm and dry. No rash noted. He  is not diaphoretic. No erythema. No pallor.  Psychiatric: He has a normal mood and affect. His behavior is normal. Judgment and thought content normal.       ZOX:WRUEAEKG:Sinus  Rhythm  WITHIN NORMAL LIMITS   ASSESSMENT AND PLAN

## 2013-09-28 NOTE — Patient Instructions (Signed)
Your physician has requested that you have an exercise tolerance test. For further information please visit [email or web address]. Please also follow instruction sheet, as given.   

## 2013-09-28 NOTE — Assessment & Plan Note (Signed)
I suspect that his exertional dyspnea is likely due to physical deconditioning. Cardiac exam is unremarkable and baseline ECG is normal. However, he does have risk factors for coronary artery disease. Thus, I proceeded with a treadmill stress test. He was able to exercise for 6 minutes without any chest discomfort or ischemic ECG changes. Based on this, the patient can start an exercise program and hopefully this will help with his weight loss.

## 2013-09-30 ENCOUNTER — Other Ambulatory Visit: Payer: Self-pay | Admitting: Cardiovascular Disease

## 2014-01-25 ENCOUNTER — Other Ambulatory Visit: Payer: Self-pay | Admitting: Nurse Practitioner

## 2014-01-25 DIAGNOSIS — M23309 Other meniscus derangements, unspecified meniscus, unspecified knee: Secondary | ICD-10-CM | POA: Insufficient documentation

## 2014-01-25 DIAGNOSIS — M1711 Unilateral primary osteoarthritis, right knee: Secondary | ICD-10-CM | POA: Insufficient documentation

## 2014-08-29 ENCOUNTER — Ambulatory Visit: Payer: PRIVATE HEALTH INSURANCE | Admitting: Nurse Practitioner

## 2015-06-19 ENCOUNTER — Encounter: Payer: Self-pay | Admitting: Internal Medicine

## 2015-06-19 ENCOUNTER — Ambulatory Visit
Admission: RE | Admit: 2015-06-19 | Discharge: 2015-06-19 | Disposition: A | Discharge status: Home / Self Care | Payer: BLUE CROSS/BLUE SHIELD | Source: Ambulatory Visit | Attending: Internal Medicine | Admitting: Internal Medicine

## 2015-06-19 ENCOUNTER — Ambulatory Visit (INDEPENDENT_AMBULATORY_CARE_PROVIDER_SITE_OTHER): Payer: BLUE CROSS/BLUE SHIELD | Admitting: Internal Medicine

## 2015-06-19 VITALS — BP 118/78 | HR 63 | Temp 97.5°F | Resp 12 | Ht 69.0 in | Wt 361.5 lb

## 2015-06-19 DIAGNOSIS — M545 Low back pain, unspecified: Secondary | ICD-10-CM

## 2015-06-19 DIAGNOSIS — Z113 Encounter for screening for infections with a predominantly sexual mode of transmission: Secondary | ICD-10-CM

## 2015-06-19 DIAGNOSIS — Z23 Encounter for immunization: Secondary | ICD-10-CM | POA: Diagnosis not present

## 2015-06-19 DIAGNOSIS — M5441 Lumbago with sciatica, right side: Secondary | ICD-10-CM

## 2015-06-19 DIAGNOSIS — R3989 Other symptoms and signs involving the genitourinary system: Secondary | ICD-10-CM | POA: Diagnosis not present

## 2015-06-19 DIAGNOSIS — E559 Vitamin D deficiency, unspecified: Secondary | ICD-10-CM

## 2015-06-19 DIAGNOSIS — M25579 Pain in unspecified ankle and joints of unspecified foot: Secondary | ICD-10-CM | POA: Insufficient documentation

## 2015-06-19 DIAGNOSIS — M5442 Lumbago with sciatica, left side: Secondary | ICD-10-CM

## 2015-06-19 DIAGNOSIS — M25571 Pain in right ankle and joints of right foot: Secondary | ICD-10-CM

## 2015-06-19 DIAGNOSIS — R300 Dysuria: Secondary | ICD-10-CM | POA: Diagnosis not present

## 2015-06-19 DIAGNOSIS — G8929 Other chronic pain: Secondary | ICD-10-CM

## 2015-06-19 LAB — COMPREHENSIVE METABOLIC PANEL
ALT: 37 U/L (ref 0–53)
AST: 19 U/L (ref 0–37)
Albumin: 4.2 g/dL (ref 3.5–5.2)
Alkaline Phosphatase: 57 U/L (ref 39–117)
BUN: 15 mg/dL (ref 6–23)
CO2: 28 mEq/L (ref 19–32)
Calcium: 9.3 mg/dL (ref 8.4–10.5)
Chloride: 105 mEq/L (ref 96–112)
Creatinine, Ser: 0.87 mg/dL (ref 0.40–1.50)
GFR: 100.67 mL/min (ref >60.00)
Glucose, Bld: 113 mg/dL — ABNORMAL HIGH (ref 70–99)
Potassium: 4.6 mEq/L (ref 3.5–5.1)
Sodium: 142 mEq/L (ref 135–145)
Total Bilirubin: 0.5 mg/dL (ref 0.2–1.2)
Total Protein: 6.7 g/dL (ref 6.0–8.3)

## 2015-06-19 LAB — POCT URINALYSIS DIPSTICK
Bilirubin, UA: NEGATIVE
Blood, UA: NEGATIVE
Glucose, UA: NEGATIVE
Ketones, UA: NEGATIVE
Leukocytes, UA: NEGATIVE
Nitrite, UA: NEGATIVE
Protein, UA: NEGATIVE
Spec Grav, UA: 1.025
Urobilinogen, UA: 0.2
pH, UA: 5.5

## 2015-06-19 LAB — LIPID PANEL
Cholesterol: 143 mg/dL (ref 0–200)
HDL: 44.1 mg/dL (ref >39.00)
LDL Cholesterol: 68 mg/dL (ref 0–99)
NonHDL: 99.12
Total CHOL/HDL Ratio: 3
Triglycerides: 157 mg/dL — ABNORMAL HIGH (ref 0.0–149.0)
VLDL: 31.4 mg/dL (ref 0.0–40.0)

## 2015-06-19 LAB — URINALYSIS, ROUTINE W REFLEX MICROSCOPIC
Bilirubin Urine: NEGATIVE
Hgb urine dipstick: NEGATIVE
Ketones, ur: NEGATIVE
Leukocytes, UA: NEGATIVE
Nitrite: NEGATIVE
RBC / HPF: NONE SEEN (ref 0–?)
Specific Gravity, Urine: 1.025 (ref 1.000–1.030)
Total Protein, Urine: NEGATIVE
Urine Glucose: NEGATIVE
Urobilinogen, UA: 0.2 (ref 0.0–1.0)
WBC, UA: NONE SEEN (ref 0–?)
pH: 6 (ref 5.0–8.0)

## 2015-06-19 LAB — CBC WITH DIFFERENTIAL/PLATELET
Basophils Absolute: 0 10*3/uL (ref 0.0–0.1)
Basophils Relative: 0.4 % (ref 0.0–3.0)
Eosinophils Absolute: 0.2 10*3/uL (ref 0.0–0.7)
Eosinophils Relative: 3.1 % (ref 0.0–5.0)
HCT: 45 % (ref 39.0–52.0)
Hemoglobin: 14.9 g/dL (ref 13.0–17.0)
Lymphocytes Relative: 29.7 % (ref 12.0–46.0)
Lymphs Abs: 1.8 10*3/uL (ref 0.7–4.0)
MCHC: 33 g/dL (ref 30.0–36.0)
MCV: 87.8 fl (ref 78.0–100.0)
Monocytes Absolute: 0.4 10*3/uL (ref 0.1–1.0)
Monocytes Relative: 6.8 % (ref 3.0–12.0)
Neutro Abs: 3.7 10*3/uL (ref 1.4–7.7)
Neutrophils Relative %: 60 % (ref 43.0–77.0)
Platelets: 240 10*3/uL (ref 150.0–400.0)
RBC: 5.13 Mil/uL (ref 4.22–5.81)
RDW: 13.4 % (ref 11.5–15.5)
WBC: 6.2 10*3/uL (ref 4.0–10.5)

## 2015-06-19 LAB — URIC ACID: Uric Acid, Serum: 7.6 mg/dL (ref 4.0–7.8)

## 2015-06-19 LAB — SEDIMENTATION RATE: Sed Rate: 7 mm/hr (ref 0–22)

## 2015-06-19 LAB — VITAMIN D 25 HYDROXY (VIT D DEFICIENCY, FRACTURES): VITD: 14.14 ng/mL — ABNORMAL LOW (ref 30.00–100.00)

## 2015-06-19 LAB — TSH: TSH: 1.27 u[IU]/mL (ref 0.35–4.50)

## 2015-06-19 LAB — C-REACTIVE PROTEIN: CRP: 0.2 mg/dL — ABNORMAL LOW (ref 0.5–20.0)

## 2015-06-19 MED ORDER — METHOCARBAMOL 500 MG PO TABS: 500.0000 mg | ORAL_TABLET | Freq: Three times a day (TID) | ORAL | Status: DC | PRN

## 2015-06-19 NOTE — Assessment & Plan Note (Signed)
I have addressed  BMI and recommended wt loss of 10% of body weigh over the next 6 months using a low glycemic index diet and regular exercise a minimum of 5 days per week.   

## 2015-06-19 NOTE — Addendum Note (Signed)
Addended by: Sherlene Shams on: 06/19/2015 01:35 PM   Modules accepted: Kipp Brood

## 2015-06-19 NOTE — Assessment & Plan Note (Signed)
Palin films negative for fracture or changes c/w disk herniation.  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  Robaxin sent to pharmacy,  Will add NSAID once I see renal function

## 2015-06-19 NOTE — Assessment & Plan Note (Signed)
UA is normal.  Culture is pending,  PAIN MAY BE REFERRED PAIN FROM LUMBAR SPINOUS PROCESS.

## 2015-06-19 NOTE — Patient Instructions (Signed)
I would like you to return in 3 months with a 12 lb weight loss   This is  my version of a  "Low GI"  Weight loss Diet:  It has enabled many of my patients to lose up to 10 lbs per month depending on how much you restrict your carbs.   follow it carefully.  The idea behind low carb diets is that your body has to take the fat and protein in your food and turn it into an energy that is less efficient than carbohydrates, so you lose weight.    I have listed several choices at each of your 6 meal times to make it easy to shop.and plan    Remember that snack Substitutions should be equal to or less than 5 NET carbs per serving and  The only carbs in meals come from the pasta or vegetables so, they should be < 15 net carbs. Remember that carbohydrates from fiber do not count, so you can  subtract fiber grams to get the "net carbs " of any particular food item.    All of the foods can be found at grocery stores and in bulk at [employer].  The Atkins protein bars and shakes are available in more varieties at Target, WalMart and [employer].     7 AM Breakfast:  Choose from the following:  Low carbohydrate Protein  Shakes (I recommend the EAS AdvantEdge "Carb Control" shakes, Atkins,  Muscle Milk or Premier Protein shakes  All are < 4  carbs . My favorite is the premier protein chocolate   a scrambled egg/bacon/cheese burrito made with Mission's "carb balance" whole wheat tortilla  (this particular tortilla has only 6 net carbs, once you subtract the fiber grams  )  A slice of home made fritatta (egg based dish without a crust:  google it)    Avoid cereal and bananas, oatmeal and cream of wheat and grits. They are loaded with carbohydrates and have too few fiber grams    10 AM: high protein snack  Protein bar by Atkins  Or KIND  (the lw sugar variety,  Not all are low , under 200 cal, usually < 6 net carbs).    A stick of cheese:  Around 1 carb,  100 cal      Other so called "protein bars" and Greek  yogurts tend to be loaded with carbohydrates.  Remember, in food advertising, the word "energy" is synonymous for " carbohydrate."  Lunch:   A Sandwich using the bread choices listed below,  You  Can use any  Eggs,  lunchmeat, grilled meat or canned tuna), avocado, regular mayo/mustard  and cheese.  A Salad using blue cheese, ranch,  Goddess or vinagrette,  No croutons or "confetti" and no "candied nuts" but regular nuts OK. No "fat free": they add sugar for taste  No pretzels or chips.  Pickles and miniature sweet peppers are a good low carb alternative that provide a "crunch"   Please Note:  The bread is the only source of carbohydrate in a sandwich and  can be decreased by trying some of these alternatives to traditional loaf bread. Karin Golden has the best selection:  Joseph's makes a pita bread and a flat ("Lavash")  bread that are 50 cal and 4 net carbs and are available at BJs and WalMart.  These can be toasted to make them taste better,  And can be  used as a pita chip to use with hummous  as well  Toufayan makes a low carb flatbread that's 100 cal and 9 net carbs available at [employer] and [employer] makes 2 sizes of  Low carb whole wheat tortilla  (The large one is 210 cal and 6 net carbs, the small is 120 cal and also 6 net carbs)  Flat Out makes flatbreads that are low carb as well . They're called "Fold Its")   Avoid "Low fat dressings, as well as Reyne Dumas and [address] dressings They are loaded with sugar!   3 PM/ Mid day  Snack:  Consider  1 ounce of  almonds, walnuts, pistachios, pecans, peanuts,  Macadamia nuts or a nut medley are ok .  Avoid "granola"; the dried cranberries and raisins are loaded with carbohydrates. Mixed nuts as long as there are no raisins,  cranberries or dried fruit.    Try the prosciutto/mozzarella cheese sticks by [employer]  In deli /backery section   High protein 80 cal each , 1 carb   To avoid overindulging in snacks: Try drinking a glass of  unsweeted almond/coconut milk  Or a cup of coffee with your Atkins chocolate bar to keep you from having 3!!!   Pork rinds!  Yes Pork Rinds  Are on the diet .  They are your potato chip.        6 PM  Dinner:     Meat/fowl/fish with a green salad, with steamed/grilled/sauteed vegggie: broccoli, cauliflower, green beans, spinach, brussel sprouts or  Lima beans. DO NOT BREAD THE PROTEIN!!      There is a low carb pasta by Dreamfield's that is acceptable and tastes great: only 5 digestible carbs/serving.( All grocery stores but BJs carry it )  Try Kai Levins Angelo's chicken piccata or chicken or eggplant parm over low carb pasta.(Lowes and BJs)   Clifton Custard Sanchez's "Carnitas" (pulled pork, no sauce,  0 carbs) can be used to make a dinner  burrito (available at BJ's ) and his pot roast is also without veggies or potatoes   Pesto over low carb pasta (bj's sells a good quality pesto in the center refrigerated section of the deli   Try satueeing  Roosvelt Harps with mushrooms as another side dish   Whole wheat pasta is still full of digestible carbs and  Not as low in glycemic index as Dreamfield's.   Brown rice is still rice,  So skip the rice and noodles if you eat [place] or [place] (or at least limit to 1 cooked cup)  9 PM snack :   Breyer's "low carb" fudgsicle or  ice cream bar (Carb Smart line), or  Weight Watcher's ice cream bar , or another "no sugar added" ice cream;  a serving of fresh berries/cherries with whipped cream   Cheese or DANNON'S LlGHT N FIT GREEK YOGURT or the Oikos greek yogurt   8 ounces of [place] Diamond unsweetened almond/cococunut milk  Cheese and crackers (using WASA crackers,  They are low carb) or peanut butter on low carb crackers or pita bread     Avoid bananas, pineapple, grapes  and watermelon on a regular basis because they are high in sugar.  THINK OF THEM AS DESSERT. Stick to" berries and cherries"

## 2015-06-19 NOTE — Progress Notes (Signed)
Subjective:  Patient ID: Stanley Fernandez, male    DOB: 1970-10-05  Age: 44 y.o. MRN: [id number]  CC: The primary encounter diagnosis was Dysuria. Diagnoses of Chronic midline low back pain without sciatica, Pain in joint, ankle and foot, right, Urethral pain, Morbid obesity, unspecified obesity type (HCC), Screen for STD (sexually transmitted disease), Vitamin D deficiency, and Encounter for immunization were also pertinent to this visit.  HPI Stanley Fernandez presents for  Flank pain and foot pain ,  Morbid obesity   Last seen Dec 2104 .  Has gained 5 lbs since then.  Has had cardiac clearance to staart exercising per dr Kirke Corin with normal stress test done in Jan 2015.  He started a walking program 3 days /week  workign with son's weights  For about 2-3 months,  Then got distracted by work and travel and stopped.. Work : Aeronautical engineer,  CAD modelling   Cc: bilateral flank pain .  Started on left side in the back  2 mnths ago  , oved around to the frot.  Sharp and intermittent.   Has urethral pain and pressure with urination .  No hesitancy , some urgency.   Left nut hurst   Right 2nd toe tender to touch  2.5 months ago after dropping luggage onto foot . Intermittent aggravated by wearing dress shoes   Outpatient Prescriptions Prior to Visit  Medication Sig Dispense Refill  . cetirizine (ZYRTEC) 10 MG tablet Take 10 mg by mouth daily.     No facility-administered medications prior to visit.    Review of Systems;  Patient denies headache, fevers, malaise, unintentional weight loss, skin rash, eye pain, sinus congestion and sinus pain, sore throat, dysphagia,  hemoptysis , cough, dyspnea, wheezing, chest pain, palpitations, orthopnea, edema, abdominal pain, nausea, melena, diarrhea, constipation, flank pain, dysuria, hematuria, urinary  Frequency, nocturia, numbness, tingling, seizures,  Focal weakness, Loss of consciousness,  Tremor, insomnia, depression, anxiety, and suicidal ideation.       Objective:  BP 118/78 mmHg  Pulse 63  Temp(Src) 97.5 F (36.4 C) (Oral)  Resp 12  Ht  (1.753 m)  Wt 361 lb 8 oz (163.975 kg)  BMI 53.36 kg/m2  SpO2 98%  BP Readings from Last 3 Encounters:  06/19/15 118/78  09/28/13 127/53  09/28/13 127/53    Wt Readings from Last 3 Encounters:  06/19/15 361 lb 8 oz (163.975 kg)  09/28/13 359 lb (162.841 kg)  09/28/13 359 lb 8 oz (163.068 kg)    General appearance: alert, cooperative and appears stated age Ears: normal TM's and external ear canals both ears Throat: lips, mucosa, and tongue normal; teeth and gums normal Neck: no adenopathy, no carotid bruit, supple, symmetrical, trachea midline and thyroid not enlarged, symmetric, no tenderness/mass/nodules Back: symmetric, no curvature. ROM normal. No CVA tenderness. Lungs: clear to auscultation bilaterally Heart: regular rate and rhythm, S1, S2 normal, no murmur, click, rub or gallop Abdomen: soft, non-tender; bowel sounds normal; no masses,  no organomegaly Pulses: 2+ and symmetric Skin: Skin color, texture, turgor normal. No rashes or lesions Lymph nodes: Cervical, supraclavicular, and axillary nodes normal.  Lab Results  Component Value Date   HGBA1C 6.1 01/21/2013    Lab Results  Component Value Date   CREATININE 0.9 01/21/2013    Lab Results  Component Value Date   GLUCOSE 140* 01/21/2013   LDLDIRECT 108.0 01/21/2013   ALT 38 01/21/2013   AST 23 01/21/2013   NA 139 01/21/2013   K 4.2  01/21/2013   CL 104 01/21/2013   CREATININE 0.9 01/21/2013   BUN 10 01/21/2013   CO2 28 01/21/2013   TSH 1.35 01/21/2013   HGBA1C 6.1 01/21/2013   MICROALBUR 1.3 08/03/2012    No results found.  Assessment & Plan:   Problem List Items Addressed This Visit    Morbid obesity (HCC)    I have addressed  BMI and recommended wt loss of 10% of body weigh over the next 6 months using a low glycemic index diet and regular exercise a minimum of 5 days per week A total of 40  minutes was spent with patient more than half of which was spent in counseling patient on the above mentioned issue , reviewing and explaining recent labs and imaging studies done, and coordination of care. .        Relevant Orders   Comprehensive metabolic panel   TSH   Lipid panel   Acute bilateral low back pain with bilateral sciatica    Palin films negative for fracture or changes c/w disk herniation.  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  Robaxin sent to pharmacy,  Will add NSAID once I see renal function       Pain in joint, ankle and foot    Pain involves only the 2nd MT right foot,  No swelling or redness .  Likely arthritis vs recurrent gout       Relevant Orders   CBC with Differential/Platelet   Sedimentation rate   Uric acid   C-reactive protein   Urethral pain    UA is normal.  Culture is pending,  PAIN MAY BE REFERRED PAIN FROM LUMBAR SPINOUS PROCESS.        Other Visit Diagnoses    Dysuria    -  Primary    Relevant Orders    POCT Urinalysis Dipstick (Completed)    Urine Culture    Urinalysis, Routine w reflex microscopic (Completed)    Screen for STD (sexually transmitted disease)        Relevant Orders    Hepatitis C antibody    HIV antibody    Vitamin D deficiency        Relevant Orders    Vit D  25 hydroxy (rtn osteoporosis monitoring)    Encounter for immunization           I am having [name] start on methocarbamol. I am also having him maintain his cetirizine.  Meds ordered this encounter  Medications  . methocarbamol (ROBAXIN) 500 MG tablet    Sig: Take 1 tablet (500 mg total) by mouth every 8 (eight) hours as needed for muscle spasms.    Dispense:  90 tablet    Refill:  1  There are no discontinued medications.  Follow-up: No Follow-up on file.   Sherlene Shams, MD

## 2015-06-19 NOTE — Progress Notes (Signed)
Pre-visit discussion using our clinic review tool. No additional management support is needed unless otherwise documented below in the visit note.  

## 2015-06-19 NOTE — Assessment & Plan Note (Signed)
Pain involves only the 2nd MT right foot,  No swelling or redness .  Likely arthritis vs recurrent gout

## 2015-06-20 ENCOUNTER — Other Ambulatory Visit: Payer: Self-pay | Admitting: Internal Medicine

## 2015-06-20 ENCOUNTER — Encounter: Payer: Self-pay | Admitting: Internal Medicine

## 2015-06-20 DIAGNOSIS — E559 Vitamin D deficiency, unspecified: Secondary | ICD-10-CM | POA: Insufficient documentation

## 2015-06-20 LAB — HIV ANTIBODY (ROUTINE TESTING W REFLEX): HIV 1&2 Ab, 4th Generation: NONREACTIVE

## 2015-06-20 LAB — URINE CULTURE
Colony Count: NO GROWTH
Organism ID, Bacteria: NO GROWTH

## 2015-06-20 LAB — HEPATITIS C ANTIBODY: HCV Ab: NEGATIVE

## 2015-06-20 MED ORDER — COLCHICINE 0.6 MG PO TABS: 0.6000 mg | ORAL_TABLET | Freq: Two times a day (BID) | ORAL | Status: DC

## 2015-06-20 MED ORDER — ERGOCALCIFEROL 1.25 MG (50000 UT) PO CAPS: 50000.0000 [IU] | ORAL_CAPSULE | ORAL | Status: DC

## 2015-09-14 ENCOUNTER — Telehealth: Payer: Self-pay | Admitting: Internal Medicine

## 2015-09-14 NOTE — Telephone Encounter (Signed)
I need a reason

## 2015-09-14 NOTE — Telephone Encounter (Signed)
Pt lvm asking for referral to Dr. Ether [facility]insFowler at [facility] for his feet. Please advise.

## 2015-09-14 NOTE — Telephone Encounter (Signed)
Please advise for a Podiatry referral.  Thanks

## 2015-09-14 NOTE — Telephone Encounter (Signed)
Attempted to call patient, was unable to leave a message for patient.

## 2015-09-15 NOTE — Telephone Encounter (Signed)
Attempted to call patient for the second time, unable to leave a message, phone just keeps ringing.

## 2017-03-26 ENCOUNTER — Ambulatory Visit (INDEPENDENT_AMBULATORY_CARE_PROVIDER_SITE_OTHER): Payer: BLUE CROSS/BLUE SHIELD | Admitting: Family Medicine

## 2017-03-26 ENCOUNTER — Ambulatory Visit
Admission: RE | Admit: 2017-03-26 | Discharge: 2017-03-26 | Disposition: A | Discharge status: Home / Self Care | Payer: BLUE CROSS/BLUE SHIELD | Source: Ambulatory Visit | Attending: Family Medicine | Admitting: Family Medicine

## 2017-03-26 ENCOUNTER — Encounter: Payer: Self-pay | Admitting: Family Medicine

## 2017-03-26 VITALS — BP 126/74 | HR 80 | Temp 97.8°F | Wt 368.5 lb

## 2017-03-26 DIAGNOSIS — R600 Localized edema: Secondary | ICD-10-CM | POA: Insufficient documentation

## 2017-03-26 NOTE — Progress Notes (Signed)
Subjective:  Patient ID: Stanley Fernandez, male    DOB: 07/19/1971  Age: 46 y.o. MRN: [id number]  CC: R leg swelling/bruising/injury  HPI:  46 year old male with morbid obesity presents with the above complaints.  Patient reports that on 7/5 [place] dock. One of the boards gave way in his right leg fell through. He subsequently injured his right ankle, knee, and thigh. He's had significant bruising and swelling. Mild pain. Associated warmth. He has had no difficulty ambulating. He is in minimal pain currently. He states that his wife wanted him to be evaluated given the severe swelling and warmth.  No reports of shortness of breath. No known exacerbating or relieving factors. No other associated symptoms. No other complaints at this time .  Social Hx   Social History   Social History  . Marital status: Married    Spouse name: N/A  . Number of children: N/A  . Years of education: N/A   Social History Main Topics  . Smoking status: Never Smoker  . Smokeless tobacco: Former NeurosurgeonUser    Quit date: 05/25/2002  . Alcohol use Yes     Comment: rare  . Drug use: No  . Sexual activity: Not Asked   Other Topics Concern  . None   Social History Narrative  . None    Review of Systems  Cardiovascular: Positive for leg swelling.  Musculoskeletal:       Right leg pain & bruising.   Objective:  BP 126/74 (BP Location: Left Arm, Patient Position: Sitting, Cuff Size: Large)   Pulse 80   Temp 97.8 F (36.6 C) (Oral)   Wt (!) 368 lb 8 oz (167.2 kg)   SpO2 96%   BMI 54.42 kg/m   BP/Weight 03/26/2017 06/19/2015 09/28/2013  Systolic BP 126 118 127  Diastolic BP 74 78 53  Wt. (Lbs) 368.5 361.5 359  BMI 54.42 53.36 52.99    Physical Exam  Constitutional: He is oriented to person, place, and time. He appears well-developed. No distress.  Pulmonary/Chest: Effort normal. No respiratory distress.  Musculoskeletal:  Right leg - normal range of motion of the knee. Nontender.  Extensive bruising noted from the thigh down to the ankle. Mild warmth. 1-2+ lower extremity edema.  Neurological: He is alert and oriented to person, place, and time.  Psychiatric: He has a normal mood and affect.  Vitals reviewed.   Lab Results  Component Value Date   WBC 6.2 06/19/2015   HGB 14.9 06/19/2015   HCT 45.0 06/19/2015   PLT 240.0 06/19/2015   GLUCOSE 113 (H) 06/19/2015   CHOL 143 06/19/2015   TRIG 157.0 (H) 06/19/2015   HDL 44.10 06/19/2015   LDLDIRECT 108.0 01/21/2013   LDLCALC 68 06/19/2015   ALT 37 06/19/2015   AST 19 06/19/2015   NA 142 06/19/2015   K 4.6 06/19/2015   CL 105 06/19/2015   CREATININE 0.87 06/19/2015   BUN 15 06/19/2015   CO2 28 06/19/2015   TSH 1.27 06/19/2015   HGBA1C 6.1 01/21/2013   MICROALBUR 1.3 08/03/2012    Assessment & Plan:   Problem List Items Addressed This Visit      Other   Lower extremity edema - Primary    New problem. Following recent injury.  I suspect this is all secondary to his injury. However, given morbid obesity and the extent of his swelling I'm obtaining an ultrasound to ensure no underlying DVT.      Relevant Orders  US Venous Img Lower Unilateral Right      Follow-up: PRN  Everlene Other DO [facility]

## 2017-03-26 NOTE — Assessment & Plan Note (Signed)
New problem. Following recent injury.  I suspect this is all secondary to his injury. However, given morbid obesity and the extent of his swelling I'm obtaining an ultrasound to ensure no underlying DVT.

## 2017-03-26 NOTE — Patient Instructions (Signed)
We will call with the results.  Take care  Dr. Chiyo Fay  

## 2017-03-28 ENCOUNTER — Telehealth: Payer: Self-pay | Admitting: Internal Medicine

## 2017-03-28 NOTE — Telephone Encounter (Signed)
Patient notified

## 2017-03-28 NOTE — Telephone Encounter (Signed)
I see these were received, another MD commented, please advise what I should tell patient. thanks

## 2017-03-28 NOTE — Telephone Encounter (Signed)
Can you call and give this to the patient, thanks

## 2017-03-28 NOTE — Telephone Encounter (Signed)
Spoke with the patient and gave him the results. verbalized understanding and wanted to let you know that he has been elevating it and trying to stay off it.  Today it is really red, below, the injury site. Per the patient it is worse then when you saw him.  He did admit to not elevating it as much today and he said he kept it in a compression wrap last night.   Concerned that the color is more red pink today and it seems worse, please advise.  I did explain we have no appts available but the walk in at [facility] is available if you think he needs to bee seen again.

## 2017-03-28 NOTE — Telephone Encounter (Signed)
Pt called and wanted the test results on his ultrasound on 03/26/17. Please call him at [phone].

## 2017-03-28 NOTE — Telephone Encounter (Signed)
Rest, elevation. If worsens (ie fever, worsening pain) should be evaluated.

## 2017-03-28 NOTE — Telephone Encounter (Signed)
No clot.  Large hematoma. Supportive care; rest, elevation.

## 2017-04-01 ENCOUNTER — Ambulatory Visit (INDEPENDENT_AMBULATORY_CARE_PROVIDER_SITE_OTHER): Payer: BLUE CROSS/BLUE SHIELD | Admitting: Family Medicine

## 2017-04-01 ENCOUNTER — Encounter: Payer: Self-pay | Admitting: Family Medicine

## 2017-04-01 DIAGNOSIS — L03115 Cellulitis of right lower limb: Secondary | ICD-10-CM | POA: Diagnosis not present

## 2017-04-01 DIAGNOSIS — R600 Localized edema: Secondary | ICD-10-CM

## 2017-04-01 DIAGNOSIS — L0390 Cellulitis, unspecified: Secondary | ICD-10-CM | POA: Insufficient documentation

## 2017-04-01 MED ORDER — DOXYCYCLINE HYCLATE 100 MG PO TABS: 100.0000 mg | ORAL_TABLET | Freq: Two times a day (BID) | ORAL | 0 refills | Status: DC

## 2017-04-01 NOTE — Assessment & Plan Note (Signed)
Established problem, persistent. Due to injury/hematoma. Discussed with sports medicine. Referring for drainage.

## 2017-04-01 NOTE — Assessment & Plan Note (Signed)
New problem. Patient with anterior lower leg warmth and erythema. He is concerned about infection. I feel that this is predominantly secondary to stasis. However, given exam findings and patient concern I am covering for cellulitis with Doxycycline.

## 2017-04-01 NOTE — Progress Notes (Signed)
Subjective:  Patient ID: Stanley Fernandez, male    DOB: 02/21/1971  Age: 46 y.o. MRN: [id number]  CC: Follow up Lower extremity edema  HPI:  46 year old male presents for follow-up regarding LE edema.  Patient recently seen on 7/11. Patient suffered an injury. Ultrasound of the right lower leg was obtained and was negative for DVT. It did reveal a large 16.4 x 2.9 x 6.9 cm fluid collection was consistent with a hematoma. Patient was advised to rest, elevate, and use ice.  Patient is today for reevaluation. Still having severe swelling. Associated decrease in sensation. He has some anterior redness. No fever. He's had improvement in bruising. He is bothered predominantly by the swelling/edema. He has no other complaints or concerns at this time.  Social Hx   Social History   Social History  . Marital status: Married    Spouse name: N/A  . Number of children: N/A  . Years of education: N/A   Social History Main Topics  . Smoking status: Never Smoker  . Smokeless tobacco: Former NeurosurgeonUser    Quit date: 05/25/2002  . Alcohol use Yes     Comment: rare  . Drug use: No  . Sexual activity: Not Asked   Other Topics Concern  . None   Social History Narrative  . None    Review of Systems  Constitutional: Negative.   Cardiovascular: Positive for leg swelling.   Objective:  BP (!) 150/90   Pulse 83   Temp 98.1 F (36.7 C) (Oral)   Wt (!) 369 lb 3.2 oz (167.5 kg)   SpO2 98%   BMI 54.52 kg/m   BP/Weight 04/01/2017 03/26/2017 06/19/2015  Systolic BP 150 126 118  Diastolic BP 90 74 78  Wt. (Lbs) 369.2 368.5 361.5  BMI 54.52 54.42 53.36   Physical Exam  Constitutional: He is oriented to person, place, and time. He appears well-developed. No distress.  Pulmonary/Chest: Effort normal. No respiratory distress.  Musculoskeletal:  Right leg - bruising noted. Calf swelling noted (severe). Mild anterior erythema with warmth. 2+ DP pulse noted.   Neurological: He is alert and oriented  to person, place, and time.  Psychiatric: He has a normal mood and affect.  Vitals reviewed.  Lab Results  Component Value Date   WBC 6.2 06/19/2015   HGB 14.9 06/19/2015   HCT 45.0 06/19/2015   PLT 240.0 06/19/2015   GLUCOSE 113 (H) 06/19/2015   CHOL 143 06/19/2015   TRIG 157.0 (H) 06/19/2015   HDL 44.10 06/19/2015   LDLDIRECT 108.0 01/21/2013   LDLCALC 68 06/19/2015   ALT 37 06/19/2015   AST 19 06/19/2015   NA 142 06/19/2015   K 4.6 06/19/2015   CL 105 06/19/2015   CREATININE 0.87 06/19/2015   BUN 15 06/19/2015   CO2 28 06/19/2015   TSH 1.27 06/19/2015   HGBA1C 6.1 01/21/2013   MICROALBUR 1.3 08/03/2012    Assessment & Plan:   Problem List Items Addressed This Visit      Other   Cellulitis    New problem. Patient with anterior lower leg warmth and erythema. He is concerned about infection. I feel that this is predominantly secondary to stasis. However, given exam findings and patient concern I am covering for cellulitis with Doxycycline.      Lower extremity edema    Established problem, persistent. Due to injury/hematoma. Discussed with sports medicine. Referring for drainage.         Meds ordered this encounter  Medications  .  doxycycline (VIBRA-TABS) 100 MG tablet    Sig: Take 1 tablet (100 mg total) by mouth 2 (two) times daily.    Dispense:  14 tablet    Refill:  0   Follow-up: PRN  Everlene Other DO [facility]

## 2017-04-01 NOTE — Patient Instructions (Signed)
Dr. Katrinka BlazingSmith, [place] [facility] (Sports Medicine).  We will call.  Tylenol as needed. No more NSAID's.  Take care  Dr. Adriana Simasook

## 2017-04-04 ENCOUNTER — Telehealth: Payer: Self-pay | Admitting: Family Medicine

## 2017-04-04 ENCOUNTER — Telehealth: Payer: Self-pay | Admitting: Internal Medicine

## 2017-04-04 NOTE — Telephone Encounter (Signed)
Dr Adriana Simasook I called Dr Katrinka BlazingSmith office and he is out of the office today. A note was sent to the nurse and they will give pt a call regarding appt. I did advise her what pt is coming in for.

## 2017-04-04 NOTE — Telephone Encounter (Signed)
Please inform patient. Stanley Fernandez is aware. Please have them work him in.

## 2017-04-04 NOTE — Telephone Encounter (Signed)
Ras[place] at [facility] called stating that Dr Adriana Simasook recommended this pt see Dr Katrinka BlazingSmith due to fluid collection in the right leg that is consistent with a hematoma. He is referring him to sports medicine for drainage. Could we work him in so that he can be seen sooner? He would be a new pt. The pt can be reached at [phone] for scheduling.

## 2017-04-04 NOTE — Telephone Encounter (Signed)
Pt called and is looking for a referral to sport medicine Dr. [place]. Please advise, thank you!  Call pt @ [phone].

## 2017-04-07 NOTE — Telephone Encounter (Signed)
Spoke to pt, scheduled tomorrow @ 2:30pm.

## 2017-04-08 ENCOUNTER — Ambulatory Visit: Payer: Self-pay

## 2017-04-08 ENCOUNTER — Other Ambulatory Visit: Payer: BLUE CROSS/BLUE SHIELD

## 2017-04-08 ENCOUNTER — Ambulatory Visit (INDEPENDENT_AMBULATORY_CARE_PROVIDER_SITE_OTHER): Payer: BLUE CROSS/BLUE SHIELD | Admitting: Family Medicine

## 2017-04-08 ENCOUNTER — Encounter: Payer: Self-pay | Admitting: Family Medicine

## 2017-04-08 VITALS — BP 140/88 | HR 112 | Ht 69.0 in | Wt 366.0 lb

## 2017-04-08 DIAGNOSIS — M79604 Pain in right leg: Secondary | ICD-10-CM

## 2017-04-08 DIAGNOSIS — Z23 Encounter for immunization: Secondary | ICD-10-CM | POA: Diagnosis not present

## 2017-04-08 DIAGNOSIS — S8011XA Contusion of right lower leg, initial encounter: Secondary | ICD-10-CM

## 2017-04-08 HISTORY — DX: Contusion of right lower leg, initial encounter: S80.11XA

## 2017-04-08 MED ORDER — TETANUS-DIPHTH-ACELL PERTUSSIS 5-2.5-18.5 LF-MCG/0.5 IM SUSP: 0.5000 mL | Freq: Once | INTRAMUSCULAR | Status: DC

## 2017-04-08 MED ORDER — DOXYCYCLINE HYCLATE 100 MG PO TABS: 100.0000 mg | ORAL_TABLET | Freq: Two times a day (BID) | ORAL | 0 refills | Status: AC

## 2017-04-08 MED ORDER — TRAMADOL HCL 50 MG PO TABS: 50.0000 mg | ORAL_TABLET | Freq: Three times a day (TID) | ORAL | 0 refills | Status: DC | PRN

## 2017-04-08 MED ORDER — CIPROFLOXACIN HCL 500 MG PO TABS: 500.0000 mg | ORAL_TABLET | Freq: Two times a day (BID) | ORAL | 0 refills | Status: DC

## 2017-04-08 NOTE — Assessment & Plan Note (Signed)
Traumatic hematoma noted. Patient responded well to the aspiration. 200 mL of blood removed. We'll send to lab for further workup. We discussed compression, put patient on doxycycline as well as ciprofloxacin for further coverage for the next 10 days. Discussed icing regimen. Patient will come back again in 72 hours for further evaluation and treatment.

## 2017-04-08 NOTE — Progress Notes (Signed)
[name] [address] [place], [place] [address] Phone: [phone] Subjective:    I'm seeing this patient by the request  of:  Sherlene Shams, MD, Adriana Simas DO   CC: Right calf injury  [id number]  Stanley Fernandez is a 46 y.o. male coming in with complaint of right calf injury. On July 5 was walking on a dock and fell through the boards. Patient injured his right ankle, knee and thigh. Had significant bruising and swelling mostly on the medial aspect. Continued have swelling in the calf. Had associated warmness. Went in and saw a provider on 03/26/2017. At that time seemed to be more of a muscle injuries. Didn't sent for Doppler to rule out deep venous thrombosis. This was independently visualized by me showing no signs of inflammation or clot the patient did have a hematoma. Patient then followed up 1 week later to further evaluate the fluid collection. Patient had been elevating ice and rest. Continued severe swelling. Having decrease in sensation on the lateral aspect of the calf. Somewhat on the medial as well. Denies any weakness though. Did have some redness. Was treated for potential infection with doxycycline. Patient states that the redness has improved a little bit since then but unfortunately the pain and swelling has not. Finding it more difficult to walk regularly. Even aching at night. Over-the-counter medications are not very helpful.  Onset-  Location Duration-  Character- Aggravating factors- Reliving factors-  Therapies tried-  Severity-     Past Medical History:  Diagnosis Date  . allergic rhinitis    managed with antihistamine  . Bright's disease   . Chicken pox   . Hypertension   . Obesity (BMI 30-39.9)   . Sleep apnea, obstructive    last study 2009   Past Surgical History:  Procedure Laterality Date  . FINGER NAIL SURGERY    . MEDIAL COLLATERAL LIGAMENT AND LATERAL COLLATERAL LIGAMENT REPAIR, KNEE  2007  .  TONSILLECTOMY AND ADENOIDECTOMY  2004   Social History   Social History  . Marital status: Married    Spouse name: N/A  . Number of children: N/A  . Years of education: N/A   Social History Main Topics  . Smoking status: Never Smoker  . Smokeless tobacco: Former Neurosurgeon    Quit date: 05/25/2002  . Alcohol use Yes     Comment: rare  . Drug use: No  . Sexual activity: Not Asked   Other Topics Concern  . None   Social History Narrative  . None   No Known Allergies Family History  Problem Relation Age of Onset  . Cancer Mother 94       breast, mets to brain   . Heart disease Father 61       AMI  . Heart attack Father      Past medical history, social, surgical and family history all reviewed in electronic medical record.  No pertanent information unless stated regarding to the chief complaint.   Review of Systems:Review of systems updated and as accurate as of 04/08/17  No headache, visual changes, nausea, vomiting, diarrhea, constipation, dizziness, abdominal pain, skin rash, fevers, chills, night sweats, weight loss, swollen lymph nodes, body aches, joint swelling, muscle aches, chest pain, shortness of breath, mood changes.   Objective  Blood pressure 140/88, pulse (!) 112, height 5\' 9"  (1.753 m), weight (!) 366 lb (166 kg), SpO2 96 %. Systems examined below as of 04/08/17   General: No  apparent distress alert and oriented x3 mood and affect normal, dressed appropriately.  HEENT: Pupils equal, extraocular movements intact  Respiratory: Patient's speak in full sentences and does not appear short of breath  Cardiovascular: No lower extremity edema, non tender, no erythema  Skin: Warm dry intact with no signs of infection or rash on extremities or on axial skeleton.  Abdomen: Soft nontender  Neuro: Cranial nerves II through XII are intact, neurovascularly intact in all extremities with 2+ DTRs and 2+ pulses.  Lymph: No lymphadenopathy of posterior or anterior cervical chain  or axillae bilaterally.  Gait Antalgic gait MSK:  Non tender with full range of motion and good stability and symmetric strength and tone of shoulders, elbows, wrist, hip, knee and bilaterally.  Right lower extremity shows the patient does have some hemosiderin deposits. Patient does have some healing of what appears to be blisters. Edema noted. Patient still has some erythema and warmness to touch of the anterior medial aspect of the leg. Possible cellulitis noted. Patient's calf is significantly distended and swelling compared to the contralateral side. Tender to palpation. Distal pulses are neurovascularly intact. Deep tendon reflex is intact in the Achilles.  Limited muscular skeletal ultrasound was performed and interpreted by Judi SaaZachary M Aliviana Burdell  Limited ultrasound the patient's right calf shows the patient has a large hematoma noted. Measuring approximately 17 x 3 x 7 cm. Very similar to the previous presentation. Impression: Nonhealing, hematoma  Procedure: Real-time Ultrasound Guided of right calf hematoma aspiration Device: GE Logiq Q7 Ultrasound guided injection is preferred based studies that show increased duration, increased effect, greater accuracy, decreased procedural pain, increased response rate, and decreased cost with ultrasound guided versus blind injection.  Verbal informed consent obtained.  Time-out conducted.  Noted no overlying erythema, induration, or other signs of local infection.  Skin prepped in a sterile fashion.  Local anesthesia: Topical Ethyl chloride.  With sterile technique and under real time ultrasound guidance: Patient with an 18-gauge 1-1/2 inch needle was injected with 3 mL of 0.5% Marcaine and then had 200 mL of venous blood removed. No signs of any infectious etiology.   Completed without difficulty  Advised to call if fevers/chills, erythema, induration, drainage, or persistent bleeding.  Images permanently stored and available for review in the  ultrasound unit.  Impression: Technically successful ultrasound guided injection.   Impression and Recommendations:     This case required medical decision making of moderate complexity.      Note: This dictation was prepared with Dragon dictation along with smaller phrase technology. Any transcriptional errors that result from this process are unintentional.

## 2017-04-08 NOTE — Patient Instructions (Addendum)
Good to see you  We drained it today  Will send it to labs Start the doxycycline and the cipro again for 10 days Try the compression as well maybe starting tomorrow morning.  If a lot of pain try tramadol.  See me Friday  If redness, worsening pain or unable to walk go to emergency room, you will be fine though.

## 2017-04-09 LAB — SYNOVIAL CELL COUNT + DIFF, W/ CRYSTALS
Basophils, %: 0 %
Eosinophils-Synovial: 2 % (ref 0–2)
Lymphocytes-Synovial Fld: 45 % (ref 0–74)
Monocyte/Macrophage: 5 % (ref 0–69)
Neutrophil, Synovial: 48 % — ABNORMAL HIGH (ref 0–24)
Synoviocytes, %: 0 % (ref 0–15)
WBC, Synovial: 441 cells/uL — ABNORMAL HIGH (ref <150)

## 2017-04-11 ENCOUNTER — Encounter: Payer: Self-pay | Admitting: Family Medicine

## 2017-04-11 ENCOUNTER — Ambulatory Visit: Payer: Self-pay

## 2017-04-11 ENCOUNTER — Ambulatory Visit (INDEPENDENT_AMBULATORY_CARE_PROVIDER_SITE_OTHER): Payer: BLUE CROSS/BLUE SHIELD | Admitting: Family Medicine

## 2017-04-11 VITALS — BP 140/86 | HR 91 | Ht 69.0 in | Wt 362.0 lb

## 2017-04-11 DIAGNOSIS — S8011XA Contusion of right lower leg, initial encounter: Secondary | ICD-10-CM

## 2017-04-11 NOTE — Progress Notes (Signed)
[facility] D[facility] [address] [place], [place] [address] Phone: [phone] Subjective:    I'm seeing this patient by the request  of:  Sherlene Shamsullo, Teresa L, MD, Adriana Simasook DO   CC: Right calf injury f/u   MVH:[id number]:Subjective  Stanley Fernandez is a 46 y.o. male coming in with complaint of right calf injury. On July 5 was walking on a dock and fell through the boards. Patient injured his right ankle, knee and thigh. Had significant bruising and swelling mostly on the medial aspect. Continued have swelling in the calf. Had associated warmness. Went in and saw a provider on 03/26/2017. At that time seemed to be more of a muscle injuries. Didn't sent for Doppler to rule out deep venous thrombosis. This was independently visualized by me showing no signs of inflammation or clot the patient did have a hematoma. Patient then followed up 1 week later to further evaluate the fluid collection. Patient had been elevating ice and rest. Continued severe swelling. Having decrease in sensation on the lateral aspect of the calf. Somewhat on the medial as well. Denies any weakness though. Did have some redness. Was treated for potential infection with doxycycline.   Patient's on the pins significant him a hematoma still. We did aspirate 200 mL of blood and previously. Patient was put in a compression dressing. Patient states Though better initially and then start having worsening symptoms. More swelling again. Continued have pain. Patient since then he was having feeling back in alignment with the increasing swelling started having worsening symptoms again.      Past Medical History:  Diagnosis Date  . allergic rhinitis    managed with antihistamine  . Bright's disease   . Chicken pox   . Hypertension   . Obesity (BMI 30-39.9)   . Sleep apnea, obstructive    last study 2009   Past Surgical History:  Procedure Laterality Date  . FINGER NAIL SURGERY    . MEDIAL COLLATERAL LIGAMENT AND  LATERAL COLLATERAL LIGAMENT REPAIR, KNEE  2007  . TONSILLECTOMY AND ADENOIDECTOMY  2004   Social History   Social History  . Marital status: Married    Spouse name: N/A  . Number of children: N/A  . Years of education: N/A   Social History Main Topics  . Smoking status: Never Smoker  . Smokeless tobacco: Former NeurosurgeonUser    Quit date: 05/25/2002  . Alcohol use Yes     Comment: rare  . Drug use: No  . Sexual activity: Not on file   Other Topics Concern  . Not on file   Social History Narrative  . No narrative on file   No Known Allergies Family History  Problem Relation Age of Onset  . Cancer Mother [id number]       breast, mets to brain   . Heart disease Father 256       AMI  . Heart attack Father      Past medical history, social, surgical and family history all reviewed in electronic medical record.  No pertanent information unless stated regarding to the chief complaint.   Review of Systems:Review of systems updated and as accurate as of 04/11/17  No headache, visual changes, nausea, vomiting, diarrhea, constipation, dizziness, abdominal pain, skin rash, fevers, chills, night sweats, weight loss, swollen lymph nodes, body aches, joint swelling,chest pain, shortness of breath, mood changes. Positive muscle aches  Objective  There were no vitals taken for this visit. Systems examined below as  of 04/11/17   Systems examined below as of 04/11/17 General: NAD A&O x3 mood, affect normal  HEENT: Pupils equal, extraocular movements intact no nystagmus Respiratory: not short of breath at rest or with speaking Cardiovascular: No lower extremity edema, non tender Skin: Warm dry intact with no signs of infection or rash on extremities or on axial skeleton. Abdomen: Soft nontender, no masses Neuro: Cranial nerves  intact, neurovascularly intact in all extremities with 2+ DTRs and 2+ pulses. Lymph: No lymphadenopathy appreciated today  Gait Antalgic gait MSK:  Non tender with full range  of motion and good stability and symmetric strength and tone of shoulders, elbows, wrist, hip, knee and bilaterally.  Right lower extremity shows significant decrease in erythema patient had previously. Hemosiderin deposits are improved as well. Still has significant swelling and tightness of the calf. Do him 2+ distal pulses. Patient does have some mild blanching of the toes but no bluish you. Neurovascularly intact.  Limited muscular skeletal ultrasound was performed and interpreted by Judi SaaZachary M Wynston Romey  Limited ultrasound the patient's right calf shows the patient has a large hematoma noted. Measuring approximately 9.5 x 3 x 4 cm. improvement from previously. Impression:  hematoma  Procedure: Real-time Ultrasound Guided Injection of right calf hematoma aspiration Device: GE Logiq Q7 Ultrasound guided injection is preferred based studies that show increased duration, increased effect, greater accuracy, decreased procedural pain, increased response rate, and decreased cost with ultrasound guided versus blind injection.  Verbal informed consent obtained.  Time-out conducted.  Noted no overlying erythema, induration, or other signs of local infection.  Skin prepped in a sterile fashion.  Local anesthesia: Topical Ethyl chloride.  With sterile technique and under real time ultrasound guidance: With an 18-gauge 1/2 inch needle patient was injected with 3 mL of 0.5% Marcaine and drained 220 mL of frank venous blood and injected 1 mL of Kenalog 40 mg/dL. Completed without difficulty  Pain immediately resolved suggesting accurate placement of the medication.  Advised to call if fevers/chills, erythema, induration, drainage, or persistent bleeding.  Images permanently stored and available for review in the ultrasound unit.  Impression: Technically successful ultrasound guided injection..   Impression and Recommendations:     This case required medical decision making of moderate complexity.       Note: This dictation was prepared with Dragon dictation along with smaller phrase technology. Any transcriptional errors that result from this process are unintentional.

## 2017-04-11 NOTE — Patient Instructions (Addendum)
Good to see you  Ice is your friend Try to keep it wrap but if toes get cold first thing to do is unwrap it.  If worsening pain, blue or black toes seek medical attention immediately  See me again on Monday

## 2017-04-11 NOTE — Assessment & Plan Note (Signed)
Recurrent accumulation again. Discussed with patient and put in a compression dressing. Patient was given a steroid injection this time will be beneficial. Hopefully this will keep it from reaccumulation again. Compression dressing was tighter and warned of potential side effects. Patient will monitor the [place] the leg. Worsening symptoms of numbness or pain to seek medical attention immediately. Has tramadol for breakthrough pain and encourage him to continue the Cipro and doxycycline. Follow-up again in 72 hours for further evaluation. If continuing to have accumulation possible MRI will be necessary as well as potentially orthopedic referral.

## 2017-04-13 LAB — BODY FLUID CULTURE
Gram Stain: NONE SEEN
Organism ID, Bacteria: NO GROWTH

## 2017-04-13 NOTE — Progress Notes (Signed)
[facility] Even Budlong D.O. [facility] [address] [place], [place] [address] Phone: [phone] Subjective:    I'm seeing this patient by the request  of:  Sherlene Shamsullo, Teresa L, MD, Adriana Simasook DO   CC: Right calf injury f/u   BJY:[id number]:Subjective  Stanley Fernandez is a [age] y.o. male coming in with complaint of right calf injury. On July 5 was walking [place] a dock and fell through the boards. Patient injured his right ankle, knee and thigh. Had significant bruising and swelling mostly on the medial aspect. Continued have swelling in the calf. Had associated warmness. Went in and saw a provider on 03/26/2017. At that time seemed to be more of a muscle injuries. Didn't sent for Doppler to rule out deep venous thrombosis. This was independently visualized by me showing no signs of inflammation or clot the patient did have a hematoma. Patient then followed up 1 week later to further evaluate the fluid collection. Patient had been elevating ice and rest. Continued severe swelling. Having decrease in sensation on the lateral aspect of the calf. Somewhat on the medial as well. Denies any weakness though. Did have some redness. Was treated for potential infection with doxycycline.   . We did aspirate 200 mL of blood and previously. For dilators we did have 220 mL removed. Patient was given a compression dressing as well as a steroid injection at last exam. Patient states doing better overall. Feels that he is making some progress. Still some discomfort but hasn't been able to have some more feeling. Has noticed some has not reaccumulated as much as it did previously.     Past Medical History:  Diagnosis Date  . allergic rhinitis    managed with antihistamine  . Bright's disease   . Chicken pox   . Hypertension   . Obesity (BMI 30-39.9)   . Sleep apnea, obstructive    last study 2009   Past Surgical History:  Procedure Laterality Date  . FINGER NAIL SURGERY    . MEDIAL COLLATERAL LIGAMENT AND LATERAL  COLLATERAL LIGAMENT REPAIR, KNEE  2007  . TONSILLECTOMY AND ADENOIDECTOMY  2004   Social History   Social History  . Marital status: Married    Spouse name: N/A  . Number of children: N/A  . Years of education: N/A   Social History Main Topics  . Smoking status: Never Smoker  . Smokeless tobacco: Former NeurosurgeonUser    Quit date: 05/25/2002  . Alcohol use Yes     Comment: rare  . Drug use: No  . Sexual activity: Not Asked   Other Topics Concern  . None   Social History Narrative  . None   No Known Allergies Family History  Problem Relation Age of Onset  . Cancer Mother 8158       breast, mets to brain   . Heart disease Father 7256       AMI  . Heart attack Father      Past medical history, social, surgical and family history all reviewed in electronic medical record.  No pertanent information unless stated regarding to the chief complaint.   Review of Systems: No headache, visual changes, nausea, vomiting, diarrhea, constipation, dizziness, abdominal pain, skin rash, fevers, chills, night sweats, weight loss, swollen lymph nodes, body aches, joint swelling, chest pain, shortness of breath, mood changes.     Objective  Blood pressure 132/84, pulse 88, height 5\' 9"  (1.753 m), weight (!) 360 lb (163.3 kg), SpO2 97 %.  Systems examined below as of 04/14/17 General: NAD A&O x3 mood, affect normal  HEENT: Pupils equal, extraocular movements intact no nystagmus Respiratory: not short of breath at rest or with speaking Cardiovascular: No lower extremity edema, non tender Skin: Warm dry intact with no signs of infection or rash on extremities or on axial skeleton. Abdomen: Soft nontender, no masses Neuro: Cranial nerves  intact, neurovascularly intact in all extremities with 2+ DTRs and 2+ pulses. Lymph: No lymphadenopathy appreciated today  Gait Antalgic gait MSK:  Non tender with full range of motion and good stability and symmetric strength and tone of shoulders, elbows, wrist,  hip, knee and bilaterally.  Right lower extremity shows no erythema. Hemosiderin deposits nearly resolved. Still has significant swelling and tightness of the calf. Good pulses. . Much better refill of the toes today.  Limited muscular skeletal ultrasound was performed and interpreted by Judi SaaZachary M Riven Beebe  Limited ultrasound the patient's right calf shows the patient has a large hematoma noted. Measuring approximately 6x2x2cm. significant improvement again Impression:  Hematoma resolving.      Impression and Recommendations:     This case required medical decision making of moderate complexity.      Note: This dictation was prepared with Dragon dictation along with smaller phrase technology. Any transcriptional errors that result from this process are unintentional.

## 2017-04-14 ENCOUNTER — Ambulatory Visit (INDEPENDENT_AMBULATORY_CARE_PROVIDER_SITE_OTHER): Payer: BLUE CROSS/BLUE SHIELD | Admitting: Family Medicine

## 2017-04-14 ENCOUNTER — Encounter: Payer: Self-pay | Admitting: Family Medicine

## 2017-04-14 ENCOUNTER — Ambulatory Visit: Payer: Self-pay

## 2017-04-14 VITALS — BP 132/84 | HR 88 | Ht 69.0 in | Wt 360.0 lb

## 2017-04-14 DIAGNOSIS — S8011XA Contusion of right lower leg, initial encounter: Secondary | ICD-10-CM | POA: Diagnosis not present

## 2017-04-14 NOTE — Assessment & Plan Note (Signed)
Patient is improving at this time. Did not have as much significant reaccumulation again. Hopefully patient will continue to do well. We discussed another repeat aspiration but patient went to watch it for the next 7-10 days. At that point he will follow-up again and we will reevaluate. If any enlargement would consider an aspiration again as well as MRI. If continuing to get smaller stable and we can continue to monitor monthly.

## 2017-04-14 NOTE — Patient Instructions (Signed)
Good to see you  Try a compression sleeve daily  The leg is looking a lot better Ice is your friend.  Keep seated at work  See me again in 7-10 days and we will measure again.

## 2017-04-22 ENCOUNTER — Ambulatory Visit: Payer: Self-pay

## 2017-04-22 ENCOUNTER — Ambulatory Visit (INDEPENDENT_AMBULATORY_CARE_PROVIDER_SITE_OTHER): Payer: BLUE CROSS/BLUE SHIELD | Admitting: Family Medicine

## 2017-04-22 ENCOUNTER — Encounter: Payer: Self-pay | Admitting: Family Medicine

## 2017-04-22 VITALS — BP 138/80 | HR 90 | Ht 69.0 in | Wt 366.0 lb

## 2017-04-22 DIAGNOSIS — S8011XD Contusion of right lower leg, subsequent encounter: Secondary | ICD-10-CM

## 2017-04-22 NOTE — Patient Instructions (Addendum)
Had to drain it again.  Ice, compression and continue to be active. When sitting though try to get it up  We will get MRI of the lower leg  We will discuss and see what is going on.

## 2017-04-22 NOTE — Progress Notes (Signed)
[name] [address] [place], [place] [address] Phone: [phone] Subjective:    I'm seeing this patient by the request  of:  Sherlene Shams, MD, Adriana Simas DO   CC: Right calf injury f/u   [id number]  Stanley Fernandez is a 46 y.o. male coming in with complaint of right calf injury. On July 5 was walking [place] dock and fell through the boards. Patient injured his right ankle, knee and thigh. Had significant bruising and swelling mostly on the medial aspect. Continued have swelling in the calf. Had associated warmness. Went in and saw a provider on 03/26/2017. At that time seemed to be more of a muscle injuries. Didn't sent for Doppler to rule out deep venous thrombosis. This was independently visualized by me showing no signs of inflammation or clot the patient did have a hematoma. Patient then followed up 1 week later to further evaluate the fluid collection. Patient had been elevating ice and rest. Continued severe swelling. Having decrease in sensation on the lateral aspect of the calf. Somewhat on the medial as well. Denies any weakness though. Did have some redness. Was treated for potential infection with doxycycline.   . We did aspirate 200 mL of blood and previously. For dilators we did have 220 mL removed. Patient was given a compression dressing as well as a steroid injection at last exam. Patient states doing better overall. Patient has noticed some increasing in size again. Patient discusses the pain as a dull, throbbing aching pain. Not as much as previously but still considers a significant amount in their.     Past Medical History:  Diagnosis Date  . allergic rhinitis    managed with antihistamine  . Bright's disease   . Chicken pox   . Hypertension   . Obesity (BMI 30-39.9)   . Sleep apnea, obstructive    last study 2009   Past Surgical History:  Procedure Laterality Date  . FINGER NAIL SURGERY    . MEDIAL COLLATERAL LIGAMENT AND  LATERAL COLLATERAL LIGAMENT REPAIR, KNEE  2007  . TONSILLECTOMY AND ADENOIDECTOMY  2004   Social History   Social History  . Marital status: Married    Spouse name: N/A  . Number of children: N/A  . Years of education: N/A   Social History Main Topics  . Smoking status: Never Smoker  . Smokeless tobacco: Former Neurosurgeon    Quit date: 05/25/2002  . Alcohol use Yes     Comment: rare  . Drug use: No  . Sexual activity: Not Asked   Other Topics Concern  . None   Social History Narrative  . None   No Known Allergies Family History  Problem Relation Age of Onset  . Cancer Mother 2       breast, mets to brain   . Heart disease Father 31       AMI  . Heart attack Father      Past medical history, social, surgical and family history all reviewed in electronic medical record.  No pertanent information unless stated regarding to the chief complaint.   Review of Systems: No headache, visual changes, nausea, vomiting, diarrhea, constipation, dizziness, abdominal pain, skin rash, fevers, chills, night sweats, weight loss, swollen lymph nodes, body aches, joint swelling,chest pain, shortness of breath, mood changes.  Positive muscle aches    Objective  Blood pressure 138/80, pulse 90, height 5\' 9"  (1.753 m), weight (!) 366 lb (166 kg), SpO2 96 %.  Systems examined below as of 04/22/17 General: NAD A&O x3 mood, affect normal Mobility obese HEENT: Pupils equal, extraocular movements intact no nystagmus Respiratory: not short of breath at rest or with speaking Cardiovascular: No lower extremity edema, non tender Skin: Warm dry intact with no signs of infection or rash on extremities or on axial skeleton. Abdomen: Soft nontender, no masses Neuro: Cranial nerves  intact, neurovascularly intact in all extremities with 2+ DTRs and 2+ pulses. Lymph: No lymphadenopathy appreciated today  Gait.antalgic.  MSK: Non tender with full range of motion and good stability and symmetric strength  and tone of shoulders, elbows, wrist,  knee hips and ankles bilaterally.   Right lower extremity shows the patient does have enlargement of the calf compared to previous exam and still significant a large compared to contralateral sign. Hemosiderin deposits of the anterior tibia improve though. Neurovascularly intact distally. Good dorsalis pulses noted. Good capillary refill of the large toe.  Limited muscular skeletal ultrasound was performed and interpreted by Judi SaaZachary M Shenae Bonanno  Limited ultrasound the patient's right calf shows the patient has a large hematoma noted. Measuring approximately 14 x 2 cm cm. significant improvement again Impression:  Hematoma enlargement from previous exam..   Procedure: Real-time Ultrasound Guided Injection of right hematoma of the lower leg Device: GE Logiq Q7 Ultrasound guided injection is preferred based studies that show increased duration, increased effect, greater accuracy, decreased procedural pain, increased response rate, and decreased cost with ultrasound guided versus blind injection.  Verbal informed consent obtained.  Time-out conducted.  Noted no overlying erythema, induration, or other signs of local infection.  Skin prepped in a sterile fashion.  Local anesthesia: Topical Ethyl chloride.  With sterile technique and under real time ultrasound guidance:  Patient injected with 2 mL of 0.5% Marcaine with an 18-gauge 1/2 inch needle patient then had aspiration done of 200 mL of blood fluid that didn't seem more serosanguineous than previous aspiration. Completed without difficulty  Pain immediately resolved suggesting accurate placement of the medication.  Advised to call if fevers/chills, erythema, induration, drainage, or persistent bleeding.  Images permanently stored and available for review in the ultrasound unit.  Impression: Technically successful ultrasound guided injection.   Impression and Recommendations:     This case required medical  decision making of moderate complexity.      Note: This dictation was prepared with Dragon dictation along with smaller phrase technology. Any transcriptional errors that result from this process are unintentional.

## 2017-04-22 NOTE — Assessment & Plan Note (Signed)
Patient is now had greater than 600 mL of blood drawn over the course last 15 days. I do feel that an MRI to further of left UE for the possibility of the seroma as well as the severity of the muscle injury is necessary at this time. Likely patient does not have any type of neurovascular compromise. Good dorsalis pedis pulses noted and patient has full strength of the lower extremity. This is all improved since previous exam. Patient's hemosiderin deposits of the lower extremity has improved as well. This said though I do want to make sure that patient does not need any surgical intervention for any small bleeding vessels or once again the possibility of a seroma. Depending on findings we'll discuss further medical management.

## 2017-04-25 ENCOUNTER — Ambulatory Visit
Admission: RE | Admit: 2017-04-25 | Discharge: 2017-04-25 | Disposition: A | Discharge status: Home / Self Care | Payer: BLUE CROSS/BLUE SHIELD | Source: Ambulatory Visit | Attending: Family Medicine | Admitting: Family Medicine

## 2017-04-25 DIAGNOSIS — S8011XD Contusion of right lower leg, subsequent encounter: Secondary | ICD-10-CM

## 2017-04-25 DIAGNOSIS — X58XXXD Exposure to other specified factors, subsequent encounter: Secondary | ICD-10-CM | POA: Insufficient documentation

## 2017-05-15 ENCOUNTER — Ambulatory Visit (INDEPENDENT_AMBULATORY_CARE_PROVIDER_SITE_OTHER): Payer: BLUE CROSS/BLUE SHIELD | Admitting: Family Medicine

## 2017-05-15 ENCOUNTER — Ambulatory Visit: Payer: Self-pay

## 2017-05-15 ENCOUNTER — Encounter: Payer: Self-pay | Admitting: Family Medicine

## 2017-05-15 VITALS — BP 138/80 | HR 74 | Wt 366.0 lb

## 2017-05-15 DIAGNOSIS — S8011XD Contusion of right lower leg, subsequent encounter: Secondary | ICD-10-CM | POA: Diagnosis not present

## 2017-05-15 DIAGNOSIS — M79604 Pain in right leg: Secondary | ICD-10-CM | POA: Diagnosis not present

## 2017-05-15 NOTE — Patient Instructions (Signed)
Good to see you  You are doing well overall.  Ice is your friend.  Stay active.  Have a good labor day  Continue the compression  See me again in 2-3 weeks and wee will see what the size is if you want.  If doing well though move it back another 2-3 weeks.

## 2017-05-15 NOTE — Progress Notes (Signed)
[facility] D.[facility] [address] [place], [place] [address] Phone: [phone] Subjective:    I'm seeing this patient by the request  of:    CC: Right lower leg pain.  BJY:[id number]:Subjective  Stanley Fernandez is a 46 y.o. male coming in for follow up for lower leg pain. He has been icing and elevating as much as possible. He notes that the circumference difference is approximately .75 to 1 inch from morning to evening with the fluid retention subsiding throughout the evening. He is still wearing the compression sleeve on his calf. He wonders if it is too tight. Patient was found to have just same subcutaneous hematoma on MRI that was independently visualized by me. Patient feels that it has not accumulated as much. Still some mild discomfort but is doing much better overall. He been doing regular activities at work    Past Medical History:  Diagnosis Date  . allergic rhinitis    managed with antihistamine  . Bright's disease   . Chicken pox   . Hypertension   . Obesity (BMI 30-39.9)   . Sleep apnea, obstructive    last study 2009   Past Surgical History:  Procedure Laterality Date  . FINGER NAIL SURGERY    . MEDIAL COLLATERAL LIGAMENT AND LATERAL COLLATERAL LIGAMENT REPAIR, KNEE  2007  . TONSILLECTOMY AND ADENOIDECTOMY  2004   Social History   Social History  . Marital status: Married    Spouse name: N/A  . Number of children: N/A  . Years of education: N/A   Social History Main Topics  . Smoking status: Never Smoker  . Smokeless tobacco: Former NeurosurgeonUser    Quit date: 05/25/2002  . Alcohol use Yes     Comment: rare  . Drug use: No  . Sexual activity: Not Asked   Other Topics Concern  . None   Social History Narrative  . None   No Known Allergies Family History  Problem Relation Age of Onset  . Cancer Mother [id number]       breast, mets to brain   . Heart disease Father 4056       AMI  . Heart attack Father      Past medical history, social, surgical  and family history all reviewed in electronic medical record.  No pertanent information unless stated regarding to the chief complaint.   Review of Systems:Review of systems updated and as accurate as of 05/15/17  No headache, visual changes, nausea, vomiting, diarrhea, constipation, dizziness, abdominal pain, skin rash, fevers, chills, night sweats, weight loss, swollen lymph nodes, body aches, joint swelling, muscle aches, chest pain, shortness of breath, mood changes.   Objective  Blood pressure 138/80, pulse 74, weight (!) 366 lb (166 kg). Systems examined below as of 05/15/17   General: No apparent distress alert and oriented x3 mood and affect normal, dressed appropriately.  HEENT: Pupils equal, extraocular movements intact  Respiratory: Patient's speak in full sentences and does not appear short of breath  Cardiovascular: No lower extremity edema, non tender, no erythema  Skin: Warm dry intact with no signs of infection or rash on extremities or on axial skeleton.  Abdomen: Soft nontender  Neuro: Cranial nerves II through XII are intact, neurovascularly intact in all extremities with 2+ DTRs and 2+ pulses.  Lymph: No lymphadenopathy of posterior or anterior cervical chain or axillae bilaterally.  Gait normal with good balance and coordination.  MSK:  Non tender with full range of motion  and good stability and symmetric strength and tone of shoulders, elbows, wrist, hip, knee and ankles bilaterally.  Right calf does show that patient does still have mild swelling compared to contralateral side.  Limited muscular skeletal ultrasound was performed and interpreted by Judi Saa  Limited ultrasound of patient's area where we've had a hematoma previously shows that we are 9 cm down from 13 cm as well as 1.6 cm down from 3.5 cm. Showing significant improvement.     Impression and Recommendations:     This case required medical decision making of moderate complexity.      Note:  This dictation was prepared with Dragon dictation along with smaller phrase technology. Any transcriptional errors that result from this process are unintentional.

## 2017-05-15 NOTE — Assessment & Plan Note (Signed)
Patient was all another aspiration at this time. Continue conservative therapy. Follow-up with me again in 3-4 weeks. Recheck one more time with ultrasound to make sure continued improvement.

## 2017-05-29 ENCOUNTER — Ambulatory Visit: Payer: BLUE CROSS/BLUE SHIELD | Admitting: Family Medicine

## 2017-06-05 ENCOUNTER — Ambulatory Visit: Payer: BLUE CROSS/BLUE SHIELD | Admitting: Family Medicine

## 2017-06-12 ENCOUNTER — Ambulatory Visit: Payer: Self-pay

## 2017-06-12 ENCOUNTER — Ambulatory Visit (INDEPENDENT_AMBULATORY_CARE_PROVIDER_SITE_OTHER): Payer: BLUE CROSS/BLUE SHIELD | Admitting: Family Medicine

## 2017-06-12 ENCOUNTER — Encounter: Payer: Self-pay | Admitting: Family Medicine

## 2017-06-12 VITALS — BP 140/110 | HR 80 | Wt 372.0 lb

## 2017-06-12 DIAGNOSIS — M79604 Pain in right leg: Secondary | ICD-10-CM

## 2017-06-12 DIAGNOSIS — S8011XD Contusion of right lower leg, subsequent encounter: Secondary | ICD-10-CM | POA: Diagnosis not present

## 2017-06-12 NOTE — Assessment & Plan Note (Signed)
Patient has mo seroma at thiscompression dressing. Warned of when to remove it. ssed icing regimen and continuing the compression stocking. We discussed avoiding certainactivities such as standing for long amount of time when possible. Patient will continue with needs tramadol but otherwise will follow-up with me again in 1-2 months.

## 2017-06-12 NOTE — Patient Instructions (Signed)
Good to see you  Making progress but slow.  Ice is your friend  If you can keep wrap on for 2 days If foot turns blue or numb then take it off.  See me again in 1-2 months

## 2017-06-12 NOTE — Progress Notes (Signed)
[name] [address] [place], [place] [address] Phone: [phone] Subjective:    I'm seeing this patient by the request  of:    CC: Right leg pain  [id number]  Stanley Fernandez is a 46 y.o. male coming in for follow up for right leg pain. Patient did have a significant injury and had a hematoma on the right lo States that the end of the day seems to be when it is worsening.       Past Medical History:  Diagnosis Date  . allergic rhinitis    managed with antihistamine  . Bright's disease   . Chicken pox   . Hypertension   . Obesity (BMI 30-39.9)   . Sleep apnea, obstructive    last study 2009   Past Surgical History:  Procedure Laterality Date  . FINGER NAIL SURGERY    . MEDIAL COLLATERAL LIGAMENT AND LATERAL COLLATERAL LIGAMENT REPAIR, KNEE  2007  . TONSILLECTOMY AND ADENOIDECTOMY  2004   Social History   Social History  . Marital status: Married    Spouse name: N/A  . Number of children: N/A  . Years of education: N/A   Social History Main Topics  . Smoking status: Never Smoker  . Smokeless tobacco: Former Neurosurgeon    Quit date: 05/25/2002  . Alcohol use Yes     Comment: rare  . Drug use: No  . Sexual activity: Not on file   Other Topics Concern  . Not on file   Social History Narrative  . No narrative on file   No Known Allergies Family History  Problem Relation Age of Onset  . Cancer Mother 21       breast, mets to brain   . Heart disease Father 35       AMI  . Heart attack Father      Past medical history, social, surgical and family history all reviewed in electronic medical record.  No pertanent information unless stated regarding to the chief complaint.   Review of Systems:Review of systems updated and as accurate as of 06/12/17  No headache, visual changes, nausea, vomiting, diarrhea, constipation, dizziness, abdominal pain, skin rash, fevers, chills, night sweats, weight loss, swollen lymph nodes, body  aches, joint swelling, muscle aches, chest pain, shortness of breath, mood changes.   Objective  There were no vitals taken for this visit. Systems examined below as of 06/12/17   General: No apparent distress alert and oriented x3 mood and affect normal, dressed appropriately. Morbidly obese HEENT: Pupils equal, extraocular movements intact  Respiratory: Patient's speak in full sentences and does not appear short of breath  Cardiovascular: No lower extremity edema, non tender, no erythema  Skin: Warm dry intact with no signs of infection or rash on extremities or on axial skeleton.  Abdomen: Soft nontender  Neuro: Cranial nerves II through XII are intact, neurovascularly intact in all extremities with 2+ DTRs and 2+ pulses.  Lymph: No lymphadenopathy of posterior or anterior cervical chain or axillae bilaterally.  Gait Mild antalgic gait MSK:  Non tender with full range of motion and good stability and symmetric strength and tone of shoulders, elbows, wrist, hip, knee and ankles bilaterally.  Right lower extremity shows the patient does have some fullness of the right calf andmore tighter than the left. Still not as bad as it has been previously.  Limited musculoskeletal ultrasound was performed and interpreted by Judi Saa  Limited ultrasound shows the patient  has had reaccumulation. Last measurement was 9 cm by 2. 2 centimeters. Now it is11.3 cm x 1.5 cm  impression: Mild enlargement.  Procedure: Real-time Ultrasound Guided Injection of  Right hematoma aspiration Device: GE Logiq Q7 Ultrasound guided injection is preferred based studies that show increased duration, increased effect, greater accuracy, decreased procedural pain, increased response rate, and decreased cost with ultrasound guided versus blind injection.  Verbal informed consent obtained.  Time-out conducted.  Noted no overlying erythema, induration, or other signs of local infection.  Skin prepped in a sterile  fashion.  Local anesthesia: Topical Ethyl chloride.  With sterile technique and under real time ultrasound guidance:  With an 18-gauge 1 and half-inch needle patient was injected with2 mL of 0.5% Marcaine and then an aspiration of 100 mL of serosanguineous fluid. 1 mL of Kenalog 40 mg/dL was injected.Pressure dressing placed Completed without difficulty  Pain immediately resolved suggesting accurate placement of the medication.  Advised to call if fevers/chills, erythema, induration, drainage, or persistent bleeding.  Images permanently stored and available for review in the ultrasound unit.  Impression: Technically successful ultrasound guided injection.    Impression and Recommendations:     This case required medical decision making of moderate complexity.      Note: This dictation was prepared with Dragon dictation along with smaller phrase technology. Any transcriptional errors that result from this process are unintentional.

## 2017-07-25 ENCOUNTER — Ambulatory Visit (INDEPENDENT_AMBULATORY_CARE_PROVIDER_SITE_OTHER): Payer: BLUE CROSS/BLUE SHIELD | Admitting: Family Medicine

## 2017-07-25 ENCOUNTER — Ambulatory Visit: Payer: Self-pay

## 2017-07-25 ENCOUNTER — Encounter: Payer: Self-pay | Admitting: Family Medicine

## 2017-07-25 VITALS — BP 160/90 | HR 98 | Ht 69.0 in | Wt 376.0 lb

## 2017-07-25 DIAGNOSIS — M79604 Pain in right leg: Secondary | ICD-10-CM

## 2017-07-25 DIAGNOSIS — S8011XD Contusion of right lower leg, subsequent encounter: Secondary | ICD-10-CM | POA: Diagnosis not present

## 2017-07-25 NOTE — Patient Instructions (Signed)
Good to see you  You know the drill  Comrpession for next week if you can.  Ice when you need it See em again in 5-6 weeks

## 2017-07-26 NOTE — Assessment & Plan Note (Signed)
Worsening symptoms again today.  Discussed with patient at great length.  Patient has had recurrent symptoms previously.  This continues to be somewhat difficult.  Patient does not want any surgical intervention.  We discussed that this may need to be repeated again over the course of time.  Can take up to 2 years to fully resolve.  Patient will continue with conservative therapy at this time.  Follow-up again in 6-8 weeks

## 2017-07-26 NOTE — Progress Notes (Signed)
[facility] Jiaire [facility]brook D.O[facility] [address] [place], [place] [address] Phone: [phone] Subjective:     CC: Right leg pain  OZH:YQMVHQIONGHPI:Subjective  Stanley Fernandez is a 46 y.o. male coming in with complaint of right leg pain.  Patient was seen previously and did have a hematoma of the right calf that needed recurrent aspiration.  This did not from last visit 6 weeks ago turned into a seroma.  Has been doing compression.  Patient states over the course last week started starting to swell significantly more again.  Patient states that it is causing discomfort even with walking.     Past Medical History:  Diagnosis Date  . allergic rhinitis    managed with antihistamine  . Bright's disease   . Chicken pox   . Hypertension   . Obesity (BMI 30-39.9)   . Sleep apnea, obstructive    last study 2009   Past Surgical History:  Procedure Laterality Date  . FINGER NAIL SURGERY    . MEDIAL COLLATERAL LIGAMENT AND LATERAL COLLATERAL LIGAMENT REPAIR, KNEE  2007  . TONSILLECTOMY AND ADENOIDECTOMY  2004   Social History   Socioeconomic History  . Marital status: Married    Spouse name: None  . Number of children: None  . Years of education: None  . Highest education level: None  Social Needs  . Financial resource strain: None  . Food insecurity - worry: None  . Food insecurity - inability: None  . Transportation needs - medical: None  . Transportation needs - non-medical: None  Occupational History  . None  Tobacco Use  . Smoking status: Never Smoker  . Smokeless tobacco: Former Engineer, waterUser  Substance and Sexual Activity  . Alcohol use: Yes    Comment: rare  . Drug use: No  . Sexual activity: None  Other Topics Concern  . None  Social History Narrative  . None   No Known Allergies Family History  Problem Relation Age of Onset  . Cancer Mother 5858       breast, mets to brain   . Heart disease Father 356       AMI  . Heart attack Father      Past medical history,  social, surgical and family history all reviewed in electronic medical record.  No pertanent information unless stated regarding to the chief complaint.   Review of Systems:Review of systems updated and as accurate  No headache, visual changes, nausea, vomiting, diarrhea, constipation, dizziness, abdominal pain, skin rash, fevers, chills, night sweats, weight loss, swollen lymph nodes, body aches, joint swelling, muscle aches, chest pain, shortness of breath, mood changes.   Objective  Blood pressure (!) 160/90, pulse 98, height 5\' 9"  (1.753 m), weight (!) 376 lb (170.6 kg), SpO2 96 %.   General: No apparent distress alert and oriented x3 mood and affect normal, dressed appropriately.  HEENT: Pupils equal, extraocular movements intact  Respiratory: Patient's speak in full sentences and does not appear short of breath  Cardiovascular: 2+ lower extremity edema, non tender, no erythema  Skin: Warm dry intact with no signs of infection or rash on extremities or on axial skeleton.  Abdomen: Soft nontender  Neuro: Cranial nerves II through XII are intact, neurovascularly intact in all extremities with 2+ DTRs and 2+ pulses.  Lymph: No lymphadenopathy of posterior or anterior cervical chain or axillae bilaterally.  Gait antalgic MSK:  Non tender with full range of motion and good stability and symmetric strength and tone  of shoulders, elbows, wrist, hip, knee and ankles bilaterally.  Patient's right leg shows swelling in the region of the calf.  Very tight to palpation.  Neurovascular intact distally. Procedure: Real-time Ultrasound Guided Injection of right hematoma Device: GE Logiq Q7 Ultrasound guided injection is preferred based studies that show increased duration, increased effect, greater accuracy, decreased procedural pain, increased response rate, and decreased cost with ultrasound guided versus blind injection.  Verbal informed consent obtained.  Time-out conducted.  Noted no overlying  erythema, induration, or other signs of local infection.  Skin prepped in a sterile fashion.  Local anesthesia: Topical Ethyl chloride.  With sterile technique and under real time ultrasound guidance: With a 18-gauge 1/2 inch needle patient was injected with 1 cc of 0.5% Marcaine and then had aspiration of 220 cc of serosanguineous fluid removed.  1 cc of Kenalog 40 mg/dL injected Completed without difficulty  Pain immediately resolved suggesting accurate placement of the medication.  Advised to call if fevers/chills, erythema, induration, drainage, or persistent bleeding.  Images permanently stored and available for review in the ultrasound unit.  Impression: Technically successful ultrasound guided injection.    Impression and Recommendations:     This case required medical decision making of moderate complexity.      Note: This dictation was prepared with Dragon dictation along with smaller phrase technology. Any transcriptional errors that result from this process are unintentional.

## 2017-08-01 ENCOUNTER — Ambulatory Visit: Payer: Self-pay | Admitting: Family Medicine

## 2017-09-05 ENCOUNTER — Ambulatory Visit (INDEPENDENT_AMBULATORY_CARE_PROVIDER_SITE_OTHER): Payer: BLUE CROSS/BLUE SHIELD | Admitting: Family Medicine

## 2017-09-05 ENCOUNTER — Encounter: Payer: Self-pay | Admitting: Family Medicine

## 2017-09-05 ENCOUNTER — Ambulatory Visit: Payer: Self-pay

## 2017-09-05 VITALS — BP 128/80 | HR 77 | Ht 68.0 in | Wt 363.8 lb

## 2017-09-05 DIAGNOSIS — M79604 Pain in right leg: Secondary | ICD-10-CM

## 2017-09-05 DIAGNOSIS — S8011XD Contusion of right lower leg, subsequent encounter: Secondary | ICD-10-CM

## 2017-09-05 NOTE — Assessment & Plan Note (Signed)
Patient is making improvement at this time.  Continue the compression, home exercises and icing regimen.  Following up again in 4 weeks to make sure that there is no reaccumulation.  Hopefully we are now finding that patient will have less reaccumulation over time.

## 2017-09-05 NOTE — Progress Notes (Signed)
Tawana ScaleZach Delise Simenson D.O. [facility] [address] [place], [place] [address] Phone: [phone] Subjective:     CC: leg pain follow up    BJY:NWGNFAOZHYHPI:Subjective  Stanley Fernandez is a 46 y.o. male coming in with complaint of leg pain.leg pain patient has had more of a seroma after a traumatic hematoma of the right lower extremity has had aspiration recently.  We aspirated 220 cc in November.  Patient states that he does not have as much swelling as last visit but that he does have some swelling. He said that his leg is 1 inch in diameter larger than last visit but it does not seem to be increasing past that one inch. He is concerned about a knot in his calf that is tender and has also been bruised.     Past Medical History:  Diagnosis Date  . allergic rhinitis    managed with antihistamine  . Bright's disease   . Chicken pox   . Hypertension   . Obesity (BMI 30-39.9)   . Sleep apnea, obstructive    last study 2009   Past Surgical History:  Procedure Laterality Date  . FINGER NAIL SURGERY    . MEDIAL COLLATERAL LIGAMENT AND LATERAL COLLATERAL LIGAMENT REPAIR, KNEE  2007  . TONSILLECTOMY AND ADENOIDECTOMY  2004   Social History   Socioeconomic History  . Marital status: Married    Spouse name: Not on file  . Number of children: Not on file  . Years of education: Not on file  . Highest education level: Not on file  Social Needs  . Financial resource strain: Not on file  . Food insecurity - worry: Not on file  . Food insecurity - inability: Not on file  . Transportation needs - medical: Not on file  . Transportation needs - non-medical: Not on file  Occupational History  . Not on file  Tobacco Use  . Smoking status: Never Smoker  . Smokeless tobacco: Former Engineer, waterUser  Substance and Sexual Activity  . Alcohol use: Yes    Comment: rare  . Drug use: No  . Sexual activity: Not on file  Other Topics Concern  . Not on file  Social History Narrative  . Not on file   No  Known Allergies Family History  Problem Relation Age of Onset  . Cancer Mother 1958       breast, mets to brain   . Heart disease Father 256       AMI  . Heart attack Father      Past medical history, social, surgical and family history all reviewed in electronic medical record.  No pertanent information unless stated regarding to the chief complaint.   Review of Systems:Review of systems updated and as accurate as of 09/05/17  No headache, visual changes, nausea, vomiting, diarrhea, constipation, dizziness, abdominal pain, skin rash, fevers, chills, night sweats, weight loss, swollen lymph nodes, body aches, joint swelling, muscle aches, chest pain, shortness of breath, mood changes.   Objective  Blood pressure 128/80, pulse 77, height 5\' 8"  (1.727 m), weight (!) 363 lb 12.8 oz (165 kg), SpO2 97 %. Systems examined below as of 09/05/17   General: No apparent distress alert and oriented x3 mood and affect normal, dressed appropriately.  HEENT: Pupils equal, extraocular movements intact  Respiratory: Patient's speak in full sentences and does not appear short of breath  Cardiovascular: No lower extremity edema, non tender, no erythema  Skin: Warm dry intact with no signs  of infection or rash on extremities or on axial skeleton.  Abdomen: Soft nontender  Neuro: Cranial nerves II through XII are intact, neurovascularly intact in all extremities with 2+ DTRs and 2+ pulses.  Lymph: No lymphadenopathy of posterior or anterior cervical chain or axillae bilaterally.  Gait normal with good balance and coordination.  MSK:  Non tender with full range of motion and good stability and symmetric strength and tone of shoulders, elbows, wrist, hip, knee and ankles bilaterally.  Patient's calf seems to be improving at this moment.  Patient still moderately enlarged from the contralateral side.  Minimal tenderness on exam today.  Less taught than previously  Limited musculoskeletal ultrasound was  performed and interpreted by Judi SaaZachary M Rowyn Spilde  limtied ultrasound show 8x.9 cm which is much improved. No sign of infection in soft tissue.  Impression: Improvement in the seroma     Impression and Recommendations:     This case required medical decision making of moderate complexity.      Note: This dictation was prepared with Dragon dictation along with smaller phrase technology. Any transcriptional errors that result from this process are unintentional.

## 2017-09-05 NOTE — Patient Instructions (Signed)
Good to see you  8 cm is great  Lets check again in 4 weeks and get you ready for [place] Keep the compression  Happy New Year!

## 2017-10-03 ENCOUNTER — Encounter: Payer: Self-pay | Admitting: Family Medicine

## 2017-10-03 ENCOUNTER — Ambulatory Visit: Payer: BLUE CROSS/BLUE SHIELD | Admitting: Family Medicine

## 2017-10-03 ENCOUNTER — Ambulatory Visit: Payer: Self-pay

## 2017-10-03 VITALS — BP 132/90 | HR 69 | Ht 69.0 in | Wt 360.0 lb

## 2017-10-03 DIAGNOSIS — M79604 Pain in right leg: Secondary | ICD-10-CM

## 2017-10-03 DIAGNOSIS — S8011XD Contusion of right lower leg, subsequent encounter: Secondary | ICD-10-CM

## 2017-10-03 NOTE — Patient Instructions (Signed)
Down to 70cc!  This is great  Measure 10x1 as well  Keep it wrap for comfort  See me again in 6 weeks!

## 2017-10-03 NOTE — Progress Notes (Signed)
[name] [address] [place], [place] [address] Phone: [phone] Subjective:      CC: Leg pain follow-up  BJY:NWGNFAOZHY  DELVON CHIPPS III is a 47 y.o. male coming in with complaint of leg pain.  Patient has been doing very well.  Has found to have a traumatic hematoma previously that has needed multiple aspirations.  Last time seem to be more of a seroma.  Aspirated 220 cc in November.  Patient states that it is starting to accumulate again.  Increasing discomfort noted.     Past Medical History:  Diagnosis Date  . allergic rhinitis    managed with antihistamine  . Bright's disease   . Chicken pox   . Hypertension   . Obesity (BMI 30-39.9)   . Sleep apnea, obstructive    last study 2009   Past Surgical History:  Procedure Laterality Date  . FINGER NAIL SURGERY    . MEDIAL COLLATERAL LIGAMENT AND LATERAL COLLATERAL LIGAMENT REPAIR, KNEE  2007  . TONSILLECTOMY AND ADENOIDECTOMY  2004   Social History   Socioeconomic History  . Marital status: Married    Spouse name: None  . Number of children: None  . Years of education: None  . Highest education level: None  Social Needs  . Financial resource strain: None  . Food insecurity - worry: None  . Food insecurity - inability: None  . Transportation needs - medical: None  . Transportation needs - non-medical: None  Occupational History  . None  Tobacco Use  . Smoking status: Never Smoker  . Smokeless tobacco: Former Engineer, water and Sexual Activity  . Alcohol use: Yes    Comment: rare  . Drug use: No  . Sexual activity: None  Other Topics Concern  . None  Social History Narrative  . None   No Known Allergies Family History  Problem Relation Age of Onset  . Cancer Mother 22       breast, mets to brain   . Heart disease Father 72       AMI  . Heart attack Father      Past medical history, social, surgical and family history all reviewed in electronic medical  record.  No pertanent information unless stated regarding to the chief complaint.   Review of Systems:Review of systems updated and as accurate as of 10/03/17  No headache, visual changes, nausea, vomiting, diarrhea, constipation, dizziness, abdominal pain, skin rash, fevers, chills, night sweats, weight loss, swollen lymph nodes, body aches, joint swelling,  chest pain, shortness of breath, mood changes.  Positive muscle aches  Objective  Blood pressure 132/90, pulse 69, height 5\' 9"  (1.753 m), weight (!) 360 lb (163.3 kg), SpO2 98 %. Systems examined below as of 10/03/17   General: No apparent distress alert and oriented x3 mood and affect normal, dressed appropriately.  HEENT: Pupils equal, extraocular movements intact  Respiratory: Patient's speak in full sentences and does not appear short of breath  Cardiovascular: No lower extremity edema, non tender, no erythema  Skin: Warm dry intact with no signs of infection or rash on extremities or on axial skeleton.  Abdomen: Soft nontender  Neuro: Cranial nerves II through XII are intact, neurovascularly intact in all extremities with 2+ DTRs and 2+ pulses.  Lymph: No lymphadenopathy of posterior or anterior cervical chain or axillae bilaterally.  Gait normal with good balance and coordination.  MSK:  Non tender with full range of motion and good stability and  symmetric strength and tone of shoulders, elbows, wrist, hip, knee and ankles bilaterally.  Patient's calf is moderately increased in diameter compared to the contralateral side.  Minor tenderness to palpation compared to contralateral side.  Neurovascular intact distally with full strength.  Limited musculoskeletal ultrasound was performed and interpreted by Judi SaaZachary M Duvan Mousel  Limited ultrasound of the cath measures the seroma at 10.7 cm x 1.2 cm.  Procedure: Real-time Ultrasound Guided Injection of cath seroma aspiration Device: GE Logiq Q7 Ultrasound guided injection is preferred based  studies that show increased duration, increased effect, greater accuracy, decreased procedural pain, increased response rate, and decreased cost with ultrasound guided versus blind injection.  Verbal informed consent obtained.  Time-out conducted.  Noted no overlying erythema, induration, or other signs of local infection.  Skin prepped in a sterile fashion.  Local anesthesia: Topical Ethyl chloride.  With sterile technique and under real time ultrasound guidance: Patient had aspiration of 70 cc of strawlike color fluid. Completed without difficulty  Pain immediately resolved suggesting accurate placement of the medication.  Advised to call if fevers/chills, erythema, induration, drainage, or persistent bleeding.  Images permanently stored and available for review in the ultrasound unit.  Impression: Technically successful ultrasound guided injection.    Impression and Recommendations:     This case required medical decision making of moderate complexity.      Note: This dictation was prepared with Dragon dictation along with smaller phrase technology. Any transcriptional errors that result from this process are unintentional.

## 2017-10-03 NOTE — Assessment & Plan Note (Signed)
Patient has done relatively well overall.  We discussed that this continues to get smaller but it is more of a seroma.  We discussed icing regimen and home exercises.  We discussed which activities of doing which wants to avoid.  Patient will increase activity slowly over the course the next several days again and continue with compression.  Follow-up with me again in 6-8 weeks.

## 2017-11-12 NOTE — Progress Notes (Signed)
[name] [address] [place], [place] [address] Phone: [phone] Subjective:      CC: [name]  [id number]  Stanley Fernandez is a 47 y.o. male coming in with complaint of calf pain.  Patient has had aspiration of a traumatic hematoma multiple times that now is a seroma.  Seems to be getting littler.  Has been wearing the compression a more regular basis.  Last aspiration was 70 cc on October 03, 2017.  This was 2 months of accumulation.  At that time initially measured 10.7 cm x 1.2 cm.  Patient states feeling much better overall.  Has not noticed any reaccumulation at this time.  Still some numbness but states that that seems to be doing relatively well.  Happy with the results so far.     Past Medical History:  Diagnosis Date  . allergic rhinitis    managed with antihistamine  . Bright's disease   . Chicken pox   . Hypertension   . Obesity (BMI 30-39.9)   . Sleep apnea, obstructive    last study 2009   Past Surgical History:  Procedure Laterality Date  . FINGER NAIL SURGERY    . MEDIAL COLLATERAL LIGAMENT AND LATERAL COLLATERAL LIGAMENT REPAIR, KNEE  2007  . TONSILLECTOMY AND ADENOIDECTOMY  2004   Social History   Socioeconomic History  . Marital status: Married    Spouse name: None  . Number of children: None  . Years of education: None  . Highest education level: None  Social Needs  . Financial resource strain: None  . Food insecurity - worry: None  . Food insecurity - inability: None  . Transportation needs - medical: None  . Transportation needs - non-medical: None  Occupational History  . None  Tobacco Use  . Smoking status: Never Smoker  . Smokeless tobacco: Former Engineer, water and Sexual Activity  . Alcohol use: Yes    Comment: rare  . Drug use: No  . Sexual activity: None  Other Topics Concern  . None  Social History Narrative  . None   No Known Allergies Family History  Problem Relation  Age of Onset  . Cancer Mother 66       breast, mets to brain   . Heart disease Father 52       AMI  . Heart attack Father      Past medical history, social, surgical and family history all reviewed in electronic medical record.  No pertanent information unless stated regarding to the chief complaint.   Review of Systems:Review of systems updated and as accurate as of 11/14/17  No headache, visual changes, nausea, vomiting, diarrhea, constipation, dizziness, abdominal pain, skin rash, fevers, chills, night sweats, weight loss, swollen lymph nodes, body aches, joint swelling,  chest pain, shortness of breath, mood changes.  Positive muscle aches  Objective  Blood pressure 130/80, pulse 83, height 5\' 8"  (1.727 m), weight (!) 361 lb (163.7 kg), SpO2 95 %. Systems examined below as of 11/14/17   General: No apparent distress alert and oriented x3 mood and affect normal, dressed appropriately.  HEENT: Pupils equal, extraocular movements intact  Respiratory: Patient's speak in full sentences and does not appear short of breath  Cardiovascular: No lower extremity edema, non tender, no erythema  Skin: Warm dry intact with no signs of infection or rash on extremities or on axial skeleton.  Abdomen: Soft nontender  Neuro: Cranial nerves II through XII are intact,  neurovascularly intact in all extremities with 2+ DTRs and 2+ pulses.  Lymph: No lymphadenopathy of posterior or anterior cervical chain or axillae bilaterally.  Gait normal with good balance and coordination.  MSK:  Non tender with full range of motion and good stability and symmetric strength and tone of shoulders, elbows, wrist, hip, knee and ankles bilaterally.  Right calf shows patient does not have any swelling at this time.  No tenderness.  Patient does have some dry skin on the anterior aspect of the tibia.  Seems to be more cracking with no sign of infection at this time.  Limited muscular skeletal ultrasound was performed and  interpreted by Judi SaaZachary M Inaara Tye  Limited ultrasound shows the patient is doing relatively well no significant reaccumulation.  Patient seroma now measures 9 x 0.5 cm.  Still surrounding soft tissue swelling noted.  Still no sign of infection Impression: Improving seroma    Impression and Recommendations:     This case required medical decision making of moderate complexity.      Note: This dictation was prepared with Dragon dictation along with smaller phrase technology. Any transcriptional errors that result from this process are unintentional.

## 2017-11-14 ENCOUNTER — Ambulatory Visit (INDEPENDENT_AMBULATORY_CARE_PROVIDER_SITE_OTHER): Payer: BLUE CROSS/BLUE SHIELD | Admitting: Family Medicine

## 2017-11-14 ENCOUNTER — Encounter: Payer: Self-pay | Admitting: Family Medicine

## 2017-11-14 ENCOUNTER — Ambulatory Visit: Payer: Self-pay

## 2017-11-14 VITALS — BP 130/80 | HR 83 | Ht 68.0 in | Wt 361.0 lb

## 2017-11-14 DIAGNOSIS — M79661 Pain in right lower leg: Secondary | ICD-10-CM | POA: Diagnosis not present

## 2017-11-14 DIAGNOSIS — S8011XD Contusion of right lower leg, subsequent encounter: Secondary | ICD-10-CM

## 2017-11-14 MED ORDER — DOXYCYCLINE HYCLATE 100 MG PO TABS: 100.0000 mg | ORAL_TABLET | Freq: Two times a day (BID) | ORAL | 0 refills | Status: DC

## 2017-11-14 NOTE — Patient Instructions (Signed)
Good to see you  Try the cream COntinue the compression  If you think infection take the doxycycline as prescribed See me again in 3 months

## 2017-11-14 NOTE — Assessment & Plan Note (Signed)
Improvement at this time.  Continue conservative therapy at this moment.  Continued compression, discussed with him likely still will need another aspiration in the future.  Patient continues to respond I would continue with conservative therapy.  We discussed the possibility of repeat MRI but we do not think we change medical management with patient not wanting any surgical intervention.  Follow-up again 12 weeks

## 2017-11-19 ENCOUNTER — Encounter: Payer: Self-pay | Admitting: Family Medicine

## 2017-11-19 ENCOUNTER — Ambulatory Visit: Payer: BLUE CROSS/BLUE SHIELD | Admitting: Family Medicine

## 2017-11-19 DIAGNOSIS — R6889 Other general symptoms and signs: Secondary | ICD-10-CM

## 2017-11-19 MED ORDER — OSELTAMIVIR PHOSPHATE 75 MG PO CAPS: 75.0000 mg | ORAL_CAPSULE | Freq: Two times a day (BID) | ORAL | 0 refills | Status: DC

## 2017-11-19 NOTE — Progress Notes (Signed)
Working [employer].   Sx started about 2 days ago.  Cough.  Some hacking cough, then progressive.  Not SOB. Then felt weaker in general.  Temp 101 last night.  Aching.  Cold sweats.  HA, worse with cough.  Clear mucous from coughing/clearing his throat.    Taking tylenol and advil and antihistamines yesterday.    No vomiting but dec in appetite.  Fatigued.  Aching diffusely.   Sx worse at night.  Hadn't had a flu shot.  He got his appetite back today, he feels a little better today.    Still with normal UOP.  Some diarrhea recently.  Meds, vitals, and allergies reviewed.   ROS: Per HPI unless specifically indicated in ROS section   GEN: nad, alert and oriented HEENT: mucous membranes moist, tm w/o erythema, nasal exam w/o erythema, clear discharge noted,  OP with cobblestoning NECK: supple w/o LA CV: rrr.   PULM: ctab, no inc wob EXT: no edema SKIN: no acute rash Sinuses not ttp x 4

## 2017-11-19 NOTE — Patient Instructions (Signed)
Start tamiflu.  Drink plenty of fluids, take tylenol as needed.  This should gradually improve.  Take care.  Let us know if you have other concerns.

## 2017-11-20 DIAGNOSIS — R6889 Other general symptoms and signs: Secondary | ICD-10-CM | POA: Insufficient documentation

## 2017-11-20 NOTE — Assessment & Plan Note (Signed)
Presumed flu.  We talked about his situation.  It would likely not make sense to test him for flu.  We would likely treat him anyway even with a possible negative test, given the chance of a false negative.  He agrees with the plan.  Routine cautions given on Tamiflu.  Okay for outpatient follow-up.  Lungs are clear.  He will update us as needed. Start tamiflu.  Drink plenty of fluids, take tylenol as needed.  This should gradually improve.

## 2018-02-03 ENCOUNTER — Other Ambulatory Visit: Payer: Self-pay

## 2018-02-03 ENCOUNTER — Emergency Department (HOSPITAL_COMMUNITY): Payer: BLUE CROSS/BLUE SHIELD

## 2018-02-03 ENCOUNTER — Encounter (HOSPITAL_COMMUNITY): Payer: Self-pay

## 2018-02-03 ENCOUNTER — Telehealth: Payer: Self-pay | Admitting: Internal Medicine

## 2018-02-03 ENCOUNTER — Ambulatory Visit: Payer: Self-pay

## 2018-02-03 ENCOUNTER — Ambulatory Visit: Payer: BLUE CROSS/BLUE SHIELD | Admitting: Family Medicine

## 2018-02-03 ENCOUNTER — Emergency Department (HOSPITAL_BASED_OUTPATIENT_CLINIC_OR_DEPARTMENT_OTHER)
Admit: 2018-02-03 | Discharge: 2018-02-03 | Disposition: A | Discharge status: Home / Self Care | Payer: BLUE CROSS/BLUE SHIELD | Attending: Emergency Medicine | Admitting: Emergency Medicine

## 2018-02-03 ENCOUNTER — Encounter: Payer: Self-pay | Admitting: Family Medicine

## 2018-02-03 ENCOUNTER — Emergency Department (HOSPITAL_COMMUNITY)
Admission: EM | Admit: 2018-02-03 | Discharge: 2018-02-03 | Disposition: A | Discharge status: Home / Self Care | Payer: BLUE CROSS/BLUE SHIELD | Attending: Emergency Medicine | Admitting: Emergency Medicine

## 2018-02-03 ENCOUNTER — Ambulatory Visit: Payer: Self-pay | Admitting: *Deleted

## 2018-02-03 VITALS — BP 122/80 | HR 128 | Ht 68.0 in | Wt 363.0 lb

## 2018-02-03 DIAGNOSIS — Z79899 Other long term (current) drug therapy: Secondary | ICD-10-CM | POA: Insufficient documentation

## 2018-02-03 DIAGNOSIS — R600 Localized edema: Secondary | ICD-10-CM | POA: Diagnosis not present

## 2018-02-03 DIAGNOSIS — M79604 Pain in right leg: Secondary | ICD-10-CM

## 2018-02-03 DIAGNOSIS — L03115 Cellulitis of right lower limb: Secondary | ICD-10-CM

## 2018-02-03 DIAGNOSIS — R0602 Shortness of breath: Secondary | ICD-10-CM | POA: Diagnosis not present

## 2018-02-03 DIAGNOSIS — R609 Edema, unspecified: Secondary | ICD-10-CM | POA: Diagnosis not present

## 2018-02-03 LAB — CBC
HCT: 45.9 % (ref 39.0–52.0)
Hemoglobin: 15.2 g/dL (ref 13.0–17.0)
MCH: 28.5 pg (ref 26.0–34.0)
MCHC: 33.1 g/dL (ref 30.0–36.0)
MCV: 86 fL (ref 78.0–100.0)
Platelets: 277 10*3/uL (ref 150–400)
RBC: 5.34 MIL/uL (ref 4.22–5.81)
RDW: 13.1 % (ref 11.5–15.5)
WBC: 14.8 10*3/uL — ABNORMAL HIGH (ref 4.0–10.5)

## 2018-02-03 LAB — I-STAT TROPONIN, ED: Troponin i, poc: 0 ng/mL (ref 0.00–0.08)

## 2018-02-03 LAB — BASIC METABOLIC PANEL
Anion gap: 10 (ref 5–15)
BUN: 9 mg/dL (ref 6–20)
CO2: 24 mmol/L (ref 22–32)
Calcium: 9.2 mg/dL (ref 8.9–10.3)
Chloride: 100 mmol/L — ABNORMAL LOW (ref 101–111)
Creatinine, Ser: 0.94 mg/dL (ref 0.61–1.24)
GFR calc Af Amer: >60 mL/min (ref >60)
GFR calc non Af Amer: >60 mL/min (ref >60)
Glucose, Bld: 132 mg/dL — ABNORMAL HIGH (ref 65–99)
Potassium: 4 mmol/L (ref 3.5–5.1)
Sodium: 134 mmol/L — ABNORMAL LOW (ref 135–145)

## 2018-02-03 LAB — I-STAT CG4 LACTIC ACID, ED
Lactic Acid, Venous: 1.47 mmol/L (ref 0.5–1.9)
Lactic Acid, Venous: 1.48 mmol/L (ref 0.5–1.9)

## 2018-02-03 MED ORDER — SODIUM CHLORIDE 0.9 % IV BOLUS
1000.0000 mL | Freq: Once | INTRAVENOUS | Status: AC
  Administered 2018-02-03: 1000 mL via INTRAVENOUS

## 2018-02-03 MED ORDER — ACETAMINOPHEN 325 MG PO TABS
650.0000 mg | ORAL_TABLET | Freq: Once | ORAL | Status: AC
  Administered 2018-02-03: 650 mg via ORAL
  Filled 2018-02-03: qty 2

## 2018-02-03 MED ORDER — HYDROCODONE-ACETAMINOPHEN 5-325 MG PO TABS: 1.0000 | ORAL_TABLET | Freq: Four times a day (QID) | ORAL | 0 refills | Status: DC | PRN

## 2018-02-03 MED ORDER — IOPAMIDOL (ISOVUE-370) INJECTION 76%
INTRAVENOUS | Status: AC
  Administered 2018-02-03: 100 mL
  Filled 2018-02-03: qty 100

## 2018-02-03 MED ORDER — CLINDAMYCIN PHOSPHATE 600 MG/50ML IV SOLN
600.0000 mg | Freq: Once | INTRAVENOUS | Status: AC
Start: 2018-02-03 — End: 2018-02-03
  Administered 2018-02-03: 600 mg via INTRAVENOUS
  Filled 2018-02-03: qty 50

## 2018-02-03 MED ORDER — CIPROFLOXACIN HCL 500 MG PO TABS: 500.0000 mg | ORAL_TABLET | Freq: Two times a day (BID) | ORAL | 0 refills | Status: DC

## 2018-02-03 NOTE — Progress Notes (Signed)
[name] [address] [place], [place] [address] Phone: [phone] Subjective:     CC: Right leg pain  BJY:NWGNFAOZHY  Stanley Fernandez is a 47 y.o. male coming in with complaint of right leg pain.  Patient has been seen for greater than a year for a hematoma of the right calf and did turn into a seroma.  Patient had been doing significantly better for quite some time but this week and started having increasing swelling and redness in the area.  Patient states that unfortunately this came on much quicker.  Feels like there is tightness of the calf area again but different.  Patient states that this is associated with redness, had a fever of 101 degrees and is having some mild shortness of breath.  Patient does not feel like himself.  Has noticed has been much more thirsty.  Having difficulty walking greater than 200 feet secondary to pain in the calf as well as shortness of breath.     Past Medical History:  Diagnosis Date  . allergic rhinitis    managed with antihistamine  . Bright's disease   . Chicken pox   . Hypertension   . Obesity (BMI 30-39.9)   . Sleep apnea, obstructive    last study 2009   Past Surgical History:  Procedure Laterality Date  . FINGER NAIL SURGERY    . MEDIAL COLLATERAL LIGAMENT AND LATERAL COLLATERAL LIGAMENT REPAIR, KNEE  2007  . TONSILLECTOMY AND ADENOIDECTOMY  2004   Social History   Socioeconomic History  . Marital status: Married    Spouse name: Not on file  . Number of children: Not on file  . Years of education: Not on file  . Highest education level: Not on file  Occupational History  . Not on file  Social Needs  . Financial resource strain: Not on file  . Food insecurity:    Worry: Not on file    Inability: Not on file  . Transportation needs:    Medical: Not on file    Non-medical: Not on file  Tobacco Use  . Smoking status: Never Smoker  . Smokeless tobacco: Former Engineer, water and Sexual  Activity  . Alcohol use: Yes    Comment: rare  . Drug use: No  . Sexual activity: Not on file  Lifestyle  . Physical activity:    Days per week: Not on file    Minutes per session: Not on file  . Stress: Not on file  Relationships  . Social connections:    Talks on phone: Not on file    Gets together: Not on file    Attends religious service: Not on file    Active member of club or organization: Not on file    Attends meetings of clubs or organizations: Not on file    Relationship status: Not on file  Other Topics Concern  . Not on file  Social History Narrative  . Not on file   No Known Allergies Family History  Problem Relation Age of Onset  . Cancer Mother 65       breast, mets to brain   . Heart disease Father 21       AMI  . Heart attack Father      Past medical history, social, surgical and family history all reviewed in electronic medical record.  No pertanent information unless stated regarding to the chief complaint.   Review of Systems:Review of systems updated  and as accurate as of 02/03/18  No visual changes, nausea, vomiting, diarrhea, constipation, dizziness, abdominal pain, skin rash, fevers, chills, night sweats, weight loss, swollen lymph nodes, body aches,  shortness of breath, mood changes.  Positive headache, shortness of breath, chest discomfort, leg swelling  Objective  Blood pressure 122/80, pulse (!) 128, height  (1.727 m), weight (!) 363 lb (164.7 kg), SpO2 97 %. Systems examined below as of 02/03/18   General: No apparent distress alert and oriented x3 mood and affect normal, dressed appropriately.  HEENT: Pupils equal, extraocular movements intact  Respiratory: Patient's speak in full sentences but does appear somewhat uncomfortable. Cardiovascular: No lower extremity edema, non tender, no erythema  Skin: Redness of the lower extremity on the right side which are warm to touch. Abdomen: Soft obese Neuro: Cranial nerves II through XII  are intact, neurovascularly intact in all extremities with 2+ DTRs and 2+ pulses.  Lymph: No lymphadenopathy of posterior or anterior cervical chain or axillae bilaterally.  Gait antalgic gait.  MSK:  Non tender with full range of motion and good stability and symmetric strength and tone of shoulders, elbows, wrist, hip, s bilaterally.  Right leg shows the patient does have some mild swelling compared to the contralateral side.  Patient does have significant redness near the ankle.  Significantly warm to touch.  Patient does have some tenderness on the anterior aspect more than the calf itself.  Neurovascularly intact distally.     Impression and Recommendations:     This case required medical decision making of moderate complexity.      Note: This dictation was prepared with Dragon dictation along with smaller phrase technology. Any transcriptional errors that result from this process are unintentional.

## 2018-02-03 NOTE — Assessment & Plan Note (Signed)
I believe the patient does have a cellulitis of the lower extremity.  Differential with a secondary to shortness of breath and swelling of the lower extremity I am concerned also for deep venous thrombosis with the potential for pulmonary embolism. Discussed an ambulance with patient declined.  Patient is going to be sent to the emergency room.  We have called ahead at this time and discussed the possibility for a pulmonary embolism.

## 2018-02-03 NOTE — Telephone Encounter (Signed)
Pt. Had an appt. With Dr.  Katrinka Blazing this am .  Was sent to the ED. Will close this Triage note.

## 2018-02-03 NOTE — ED Triage Notes (Addendum)
Pt states he has had persistent right leg pain, swelling and redness. He also states he has had shortness of breath and headache. Pt alert and oriented in triage. Pt states redness in his leg started yesterday. Pt states pcp started him on doxycycline.

## 2018-02-03 NOTE — ED Provider Notes (Signed)
Patient placed in Quick Look pathway, seen and evaluated   Chief Complaint: RLE pain/swelling, CP, SOB  HPI:   47 y.o. presents for evaluation of right lower extremity swelling, edema, erythema that has been ongoing since last night.  Additionally, patient has been having chest pain, shortness of breath.  He describes it as a central chest pressure that radiates to the back.  Patient reports that he feels like he cannot take a deep breath in.  Patient reports that when he walks around, his shortness of breath becomes significantly worse.  He states that baseline, he is able to ambulate without any difficulty or shortness of breath.  Patient went to his primary care doctor today for evaluation.  On exam, there was some concern that this may be related to DVT/PE.  Patient was sent to the ED for further evaluation.  Patient had been given 1 dose of antibiotics (doxycycline) prior to ED arrival for treatment of possible cellulitis of right lower extremity.  Patient denies any personal or family cardiac history. She recent immobilization, prior history of DVT/PE, recent surgery, leg swelling, or long travel.   ROS: SOB, CP, RLE edema   Physical Exam:   Gen: No distress  Neuro: Awake and Alert  Skin: Warm    Focused Exam: Patient is tachycardic, tachypneic.  Obvious increased work of breathing, equal pulses in all 4 extremities.  Right lower extremity is erythematous, warm.  Primary care doctor had called the ED to check on patient status.  RN notified me that Dr. was concerned about insufficient work-up.  I pulled patient back from the waiting room and personally evaluated him.  Care was expedited based on my initial evaluation.    5:04 PM: Informed charge nurse that patient need to move back to the ED immediately.    Initiation of care has begun. The patient has been counseled on the process, plan, and necessity for staying for the completion/evaluation, and the remainder of the medical screening  examination    Rosana Hoes 02/03/18 1707    Nira Conn, MD 02/04/18 2059

## 2018-02-03 NOTE — ED Provider Notes (Signed)
Medical screening examination/treatment/procedure(s) were conducted as a shared visit with non-physician practitioner(s) and myself.  I personally evaluated the patient during the encounter.  Clinical Impression:   Final diagnoses:  Cellulitis of right lower leg     Hx: The patient is a very obese 47 year old male, presents with a complaint of swelling and redness with a low-grade fever to his right leg.  Noted to be mildly tachycardic.  On my exam the patient has increased swelling and tenderness with redness extending from his lower leg all the way up through the knee.  He has a temperature of 100.2 and a white blood cell count of over 14,000 consistent with infection.    Ultrasound of the leg, CT angiogram,  Lab work, antibiotics  Anticipate admission for cellulitis as he has been on doxycycline with no significant improvement.  Anticoagulate if clot present.   Eber Hong, MD 02/07/18 [phone]

## 2018-02-03 NOTE — ED Notes (Signed)
PT states understanding of care given, follow up care, and medication prescribed. PT ambulated from ED to car with a steady gait. 

## 2018-02-03 NOTE — Telephone Encounter (Signed)
Copied from CRM [id number]. Topic: Quick Communication - See Telephone Encounter >> Feb 03, 2018  3:39 PM Landry Mellow wrote: CRM for notification. See Telephone encounter for: 02/03/18. Pt called from the ED - he is asking for abx that was discussed at his appt to be called into [facility] at [address].  Cb is [phone]   Pt was seen by Dr Katrinka Blazing today

## 2018-02-03 NOTE — Telephone Encounter (Signed)
Sent in the Cipro at this time to expand patient's doxycycline.  Patient is still in the emergency room.  Once again concerned significantly for pulmonary embolism and did discuss with charge nurse at the time of transfer.  Reviewing patient's chart he has not had appropriate work-up at this time. Will see if we can call and get more information

## 2018-02-03 NOTE — Patient Instructions (Signed)
Good to see you  Sorry for the bad news Please go to the emergency room.  If you feel worse please pull over and call [phone] We will call ahead for you  They will check your heart and your labs

## 2018-02-03 NOTE — Progress Notes (Signed)
Right lower extremity venous duplex has been completed. Negative for DVT. Results were given to Graciella Freer PA.  02/03/18 5:58 PM Olen Cordial RVT

## 2018-02-03 NOTE — ED Provider Notes (Signed)
[facility] EMERGENCY DEPARTMENT Provider Note   CSN: [id number] Arrival date & time: 02/03/18  1135     History   Chief Complaint Chief Complaint  Patient presents with  . Leg Swelling  . Shortness of Breath    HPI Stanley Fernandez is a 47 y.o. male.  HPI Stanley Fernandez is a 47 y.o. male presents to emergency department complaining of right leg swelling and shortness of breath.  Patient states that his right leg became red, swollen, painful yesterday.  He states that he injured that leg last summer and has been working with his doctor on rehabbing it.  He states it would swell up and down since the injury.  He reports since yesterday the leg has been the most swollen he has ever seen it to be and now red and hot.  He reports he feels like he has a low-grade fever.  He states he feels generally weak.  He reports chest tightness and shortness of breath and less pain when taking deep breaths.  States that started yesterday as well.  Denies any recent injuries.  No recent travel or surgeries.  No history of blood clots.  Denies any other complaints at this time.  Went to PCP is sent here for further evaluation.  Past Medical History:  Diagnosis Date  . allergic rhinitis    managed with antihistamine  . Bright's disease   . Chicken pox   . Hypertension   . Obesity (BMI 30-39.9)   . Sleep apnea, obstructive    last study 2009    Patient Active Problem List   Diagnosis Date Noted  . Shortness of breath 02/03/2018  . Flu-like symptoms 11/20/2017  . Traumatic hematoma of right lower leg 04/08/2017  . Cellulitis 04/01/2017  . Lower extremity edema 03/26/2017  . Vitamin D deficiency 06/20/2015  . Dyspnea 09/28/2013  . Allergic rhinitis 01/23/2013  . Blood pressure elevated without history of HTN 08/03/2012  . Chronic right shoulder pain 05/25/2012  . Sleep apnea, obstructive   . Morbid obesity (HCC)     Past Surgical History:  Procedure Laterality  Date  . FINGER NAIL SURGERY    . MEDIAL COLLATERAL LIGAMENT AND LATERAL COLLATERAL LIGAMENT REPAIR, KNEE  2007  . TONSILLECTOMY AND ADENOIDECTOMY  2004        Home Medications    Prior to Admission medications   Medication Sig Start Date End Date Taking? Authorizing Provider  acetaminophen (TYLENOL) 500 MG tablet Take 1,000 mg by mouth every 6 (six) hours as needed.   Yes [provider]  cetirizine (ZYRTEC) 10 MG tablet Take 10 mg by mouth daily.   Yes [provider]  doxycycline (VIBRAMYCIN) 100 MG capsule Take 100 mg by mouth daily at 12 noon.   Yes [provider]  PRESCRIPTION MEDICATION Apply 1 application topically daily as needed. Shin   Yes [provider]  ciprofloxacin (CIPRO) 500 MG tablet Take 1 tablet (500 mg total) by mouth 2 (two) times daily. 02/03/18   Judi Saa, DO  oseltamivir (TAMIFLU) 75 MG capsule Take 1 capsule (75 mg total) by mouth 2 (two) times daily. Patient not taking: Reported on 02/03/2018 11/19/17   Joaquim Nam, MD  traMADol (ULTRAM) 50 MG tablet Take 1 tablet (50 mg total) by mouth every 8 (eight) hours as needed. Patient not taking: Reported on 02/03/2018 04/08/17   Judi Saa, DO    Family History Family History  Problem  Relation Age of Onset  . Cancer Mother 80       breast, mets to brain   . Heart disease Father 54       AMI  . Heart attack Father     Social History Social History   Tobacco Use  . Smoking status: Never Smoker  . Smokeless tobacco: Former Engineer, water Use Topics  . Alcohol use: Yes    Comment: rare  . Drug use: No     Allergies   Patient has no known allergies.   Review of Systems Review of Systems  Constitutional: Positive for chills, fatigue and fever.  Respiratory: Positive for chest tightness and shortness of breath. Negative for cough.   Cardiovascular: Positive for chest pain and leg swelling. Negative for palpitations.  Gastrointestinal: Negative for  abdominal distention, abdominal pain, diarrhea, nausea and vomiting.  Genitourinary: Negative for dysuria, frequency, hematuria and urgency.  Musculoskeletal: Positive for arthralgias and joint swelling. Negative for myalgias, neck pain and neck stiffness.  Skin: Positive for color change. Negative for rash.  Allergic/Immunologic: Negative for immunocompromised state.  Neurological: Positive for weakness. Negative for dizziness, light-headedness, numbness and headaches.  All other systems reviewed and are negative.    Physical Exam Updated Vital Signs BP (!) 162/99 (BP Location: Right Arm)   Pulse (!) 121   Temp 99.6 F (37.6 C) (Oral)   Resp 18   SpO2 96%   Physical Exam  Constitutional: He appears well-developed and well-nourished. No distress.  HENT:  Head: Normocephalic and atraumatic.  Eyes: Conjunctivae are normal.  Neck: Neck supple.  Cardiovascular: Normal rate, regular rhythm and normal heart sounds.  Pulmonary/Chest: Effort normal. No respiratory distress. He has no wheezes. He has no rales.  Abdominal: Soft. Bowel sounds are normal. He exhibits no distension. There is no tenderness. There is no rebound.  Musculoskeletal: He exhibits edema.  Right leg is significantly more swollen than the left.  It is red, warm to the touch, diffuse tenderness over anterior shin.  Swelling and erythema extends up to the knee.  Distal pulses intact.  Capillary refill less than 2 seconds distally.  Neurological: He is alert.  Skin: Skin is warm and dry.  Nursing note and vitals reviewed.        ED Treatments / Results  Labs (all labs ordered are listed, but only abnormal results are displayed) Labs Reviewed  BASIC METABOLIC PANEL - Abnormal; Notable for the following components:      Result Value   Sodium 134 (*)    Chloride 100 (*)    Glucose, Bld 132 (*)    All other components within normal limits  CBC - Abnormal; Notable for the following components:   WBC 14.8 (*)     All other components within normal limits  CULTURE, BLOOD (ROUTINE X 2)  CULTURE, BLOOD (ROUTINE X 2)  BRAIN NATRIURETIC PEPTIDE  I-STAT TROPONIN, ED  I-STAT CG4 LACTIC ACID, ED  I-STAT CG4 LACTIC ACID, ED    EKG None  Radiology Dg Chest 2 View  Result Date: 02/03/2018 CLINICAL DATA:  Shortness of breath. EXAM: CHEST - 2 VIEW COMPARISON:  None. FINDINGS: Poor inspiratory effort. Heart size upper limits normal. Slight vascular congestion. No consolidation or edema. No effusion or pneumothorax. Mild degenerative change thoracic spine. IMPRESSION: Low lung volumes. Borderline cardiac enlargement. Mild vascular congestion. Electronically Signed   By: Elsie Stain M.D.   On: 02/03/2018 12:15    Procedures Procedures (including critical care time)  Medications  Ordered in ED Medications  iopamidol (ISOVUE-370) 76 % injection (100 mLs  Contrast Given 02/03/18 1713)  sodium chloride 0.9 % bolus 1,000 mL (0 mLs Intravenous Stopped 02/03/18 1931)  acetaminophen (TYLENOL) tablet 650 mg (650 mg Oral Given 02/03/18 1819)  clindamycin (CLEOCIN) IVPB 600 mg (0 mg Intravenous Stopped 02/03/18 1932)     Initial Impression / Assessment and Plan / ED Course  I have reviewed the triage vital signs and the nursing notes.  Pertinent labs & imaging results that were available during my care of the patient were reviewed by me and considered in my medical decision making (see chart for details).     5:31 PM I just evaluated pt, on my evaluation, temp 100.3, tachcyardic. Will start IV fluids, antibiotics for possible cellulitis.  CTA and Venous duplex pending.   7:07 PM CT angios negative.  Venous duplex of the legs is negative.  Lactic acid repeat is normal.  Patient's heart rate is down in the 90s.  He is not diabetic or immunocompromise.  His pain and swelling and redness in the leg is most likely due to infection, cellulitis.  He has only had 1 dose of doxycycline at home, so technically did not fail  outpatient treatment.  He received clindamycin in emergency department.  I will discharge him home, continue doxycycline, keep leg elevated, keep close eye on the redness and swelling.  If progresses rapidly or not improving he will come back to emergency department.  Otherwise close follow-up with family doctor.  Vitals:   02/03/18 1141 02/03/18 1738 02/03/18 1745 02/03/18 1925  BP: (!) 162/99 (!) 156/83 (!) 160/98 (!) 158/83  Pulse: (!) 121 (!) 101 98 96  Resp: Temp: 99.6 F (37.6 C) 100.2 F (37.9 C)    TempSrc: Oral Oral    SpO2: 96% 97% 99% 97%  Weight:  (!) 163.7 kg (361 lb)    Height:   (1.753 m)       Final Clinical Impressions(s) / ED Diagnoses   Final diagnoses:  Cellulitis of right lower leg    ED Discharge Orders        Ordered    HYDROcodone-acetaminophen (NORCO) 5-325 MG tablet  Every 6 hours PRN     02/03/18 1914       Jaynie Crumble, Cordelia Poche 02/03/18 2147    Eber Hong, MD 02/07/18 [phone]

## 2018-02-03 NOTE — ED Notes (Signed)
Pt MD called to check on the pt. PA updated on pt status and MD phone call. Pt pulled back to triage for additional testing to be placed.

## 2018-02-03 NOTE — Assessment & Plan Note (Signed)
Concern for DVT with the potential for a PE.  Differential includes a cellulitis due to warmth and redness.  Patient has taken 1 dose of doxycycline at this time.  Patient is going to the emergency room.  Declined an ambulance.  Patient is driving himself and we have called ahead to discuss with the emergency room.

## 2018-02-03 NOTE — Discharge Instructions (Addendum)
Elevate your leg. Stay off of it as much as possible. Take pain medications as prescribed as needed. Take antibiotics until all gone. If worsening in the next two days, return to ER for IV antibiotics. If improving, follow up with family doctor in 3-5 days.

## 2018-02-04 ENCOUNTER — Ambulatory Visit: Payer: BLUE CROSS/BLUE SHIELD | Admitting: Family Medicine

## 2018-02-04 ENCOUNTER — Telehealth: Payer: Self-pay | Admitting: Family Medicine

## 2018-02-04 NOTE — Telephone Encounter (Signed)
Copied from CRM [id number]. Topic: Quick Communication - See Telephone Encounter >> Feb 04, 2018  9:22 AM Terisa Starr wrote: CRM for notification. See Telephone encounter for: 02/04/18.  Patient wants to know does he need to take ciprofloxacin (CIPRO) 500 MG tablet along with doxycycline (VIBRAMYCIN) 100 MG capsule because he picked up both from the pharmacy. Please advise.  Call back is [phone]

## 2018-02-05 ENCOUNTER — Telehealth: Payer: Self-pay | Admitting: Internal Medicine

## 2018-02-05 NOTE — Progress Notes (Signed)
[name] [address] [place], [place] [address] Phone: [phone] Subjective:    I'm seeing this patient by the request  of:    CC: Leg pain follow-up  [id number]  Stanley Fernandez is a 47 y.o. male coming in with complaint of right leg pain.  Patient has had a history of a seroma laboratory Injury.  Patient had significant redness and appeared to have some septic findings.  Concern for deep venous thrombosis as well as a pulmonary embolism secondary to shortness of breath.  Sent to the emergency room at last visit.  Patient did have work-up showing an elevated white blood cell count.  Has been now on doxycycline as well as ciprofloxacin.  Patient states that he feels like he is doing better. He hasn't been able to eat much throughout the week. He did eat dinner on Monday but felt he was going to be sick. Patient said that he has been feeling feverish and fatigued. Patient is still having some swelling but the tenderness has gone down.      Past Medical History:  Diagnosis Date  . allergic rhinitis    managed with antihistamine  . Bright's disease   . Chicken pox   . Hypertension   . Obesity (BMI 30-39.9)   . Sleep apnea, obstructive    last study 2009   Past Surgical History:  Procedure Laterality Date  . FINGER NAIL SURGERY    . MEDIAL COLLATERAL LIGAMENT AND LATERAL COLLATERAL LIGAMENT REPAIR, KNEE  2007  . TONSILLECTOMY AND ADENOIDECTOMY  2004   Social History   Socioeconomic History  . Marital status: Married    Spouse name: Not on file  . Number of children: Not on file  . Years of education: Not on file  . Highest education level: Not on file  Occupational History  . Not on file  Social Needs  . Financial resource strain: Not on file  . Food insecurity:    Worry: Not on file    Inability: Not on file  . Transportation needs:    Medical: Not on file    Non-medical: Not on file  Tobacco Use  . Smoking status: Never  Smoker  . Smokeless tobacco: Former Engineer, water and Sexual Activity  . Alcohol use: Yes    Comment: rare  . Drug use: No  . Sexual activity: Not on file  Lifestyle  . Physical activity:    Days per week: Not on file    Minutes per session: Not on file  . Stress: Not on file  Relationships  . Social connections:    Talks on phone: Not on file    Gets together: Not on file    Attends religious service: Not on file    Active member of club or organization: Not on file    Attends meetings of clubs or organizations: Not on file    Relationship status: Not on file  Other Topics Concern  . Not on file  Social History Narrative  . Not on file   No Known Allergies Family History  Problem Relation Age of Onset  . Cancer Mother 13       breast, mets to brain   . Heart disease Father 53       AMI  . Heart attack Father      Past medical history, social, surgical and family history all reviewed in electronic medical record.  No pertanent information unless stated regarding  to the chief complaint.   Review of Systems:Review of systems updated and as accurate as of 02/06/18  No headache, visual changes, nausea, vomiting, diarrhea, constipation, dizziness skin rash, fevers, chills, night sweats, weight loss, swollen lymph nodes, body aches,  chest pain, shortness of breath, mood changes.  Positive muscle aches joint swelling mild abdominal pain  Objective  Blood pressure 132/86, pulse 97, height  (1.753 m), weight (!) 350 lb (158.8 kg), SpO2 97 %. Systems examined below as of 02/06/18   General: No apparent distress alert and oriented x3 mood and affect normal, dressed appropriately.  HEENT: Pupils equal, extraocular movements intact  Respiratory: Patient's speak in full sentences and does not appear short of breath  Cardiovascular: No lower extremity edema, non tender, no erythema  Skin: Warm dry intact with no signs of infection or rash on extremities or on axial skeleton.   Abdomen: Soft nontender  Neuro: Cranial nerves II through XII are intact, neurovascularly intact in all extremities with 2+ DTRs and 2+ pulses.  Lymph: No lymphadenopathy of posterior or anterior cervical chain or axillae bilaterally.  Gait antalgic gait MSK:  Non tender with full range of motion and good stability and symmetric strength and tone of shoulders, elbows, wrist, hip, knee bilaterally.  Right lower extremity shows the patient does have the swelling over the medial gastroc that is more secondary to his seroma and appears.  This is different than when we saw him 3 days ago.  Redness and irritation has decreased and more localized to the anterior aspect of the shin.  Still somewhat warm to palpation and still tender.     Impression and Recommendations:     This case required medical decision making of moderate complexity.      Note: This dictation was prepared with Dragon dictation along with smaller phrase technology. Any transcriptional errors that result from this process are unintentional.

## 2018-02-05 NOTE — Telephone Encounter (Unsigned)
Copied from CRM [id number]. Topic: Appointment Scheduling - Scheduling Inquiry for Clinic >> Feb 05, 2018 12:15 PM Stanley Fernandez wrote: Pt was seen in the hospital this week and released Tuesday and was told to see his PCP with in 3 days regarding the infection of his right leg-he has an appt with dr Antoine Primas on Friday 02/06/18 at 800 and [place] does not have any thing until Thursday 02/12/18 and pt does not feel that  he needs to wait that long he feels that he needs a second opinion beside Dr. Katrinka Blazing to make sure his leg is getting better   Best number [phone]

## 2018-02-05 NOTE — Telephone Encounter (Signed)
Patient has been scheduled to see Dr. Darrick Huntsman on 02-10-18. Patient is aware of this appt.

## 2018-02-06 ENCOUNTER — Encounter: Payer: Self-pay | Admitting: Family Medicine

## 2018-02-06 ENCOUNTER — Ambulatory Visit: Payer: BLUE CROSS/BLUE SHIELD | Admitting: Family Medicine

## 2018-02-06 DIAGNOSIS — L03115 Cellulitis of right lower limb: Secondary | ICD-10-CM | POA: Diagnosis not present

## 2018-02-06 DIAGNOSIS — S8011XD Contusion of right lower leg, subsequent encounter: Secondary | ICD-10-CM

## 2018-02-06 NOTE — Patient Instructions (Signed)
Good to see you  Ice is your friend I think Continue the antibiotics Watch the hip pain a little Try elevation  loion the skin daily at least and alternate the lotions I gave you  See me Thursday next week (Ok to double book)

## 2018-02-06 NOTE — Assessment & Plan Note (Signed)
Following up in 1 week for possible aspiration

## 2018-02-06 NOTE — Assessment & Plan Note (Signed)
Improved at this time.  Patient had work-up and ruled out any deep venous thrombosis or potential pulmonary embolism.  Patient is feeling better on the antibiotic a little bit but still does not feel like himself.  Patient has had some weight loss but states that he has not been eating very well.  Discussed with patient to monitor this closely.  No fevers at this time though since his initial one on Tuesday.  Continue the doxycycline and ciprofloxacin for broad coverage.  Patient does have blood culture still pending.  Patient does seem to be having an increasing in the hematoma/seroma again and will follow-up next week and see if aspiration would be safely done at that time.  Did not feel comfortable doing that at the moment with him still showing signs of cellulitis.

## 2018-02-08 LAB — CULTURE, BLOOD (ROUTINE X 2)
Culture: NO GROWTH
Culture: NO GROWTH
Special Requests: ADEQUATE
Special Requests: ADEQUATE

## 2018-02-10 ENCOUNTER — Encounter: Payer: Self-pay | Admitting: Internal Medicine

## 2018-02-10 ENCOUNTER — Ambulatory Visit: Payer: BLUE CROSS/BLUE SHIELD | Admitting: Internal Medicine

## 2018-02-10 VITALS — BP 132/82 | HR 99 | Temp 98.2°F | Resp 17 | Ht 69.0 in | Wt 356.6 lb

## 2018-02-10 DIAGNOSIS — R7301 Impaired fasting glucose: Secondary | ICD-10-CM

## 2018-02-10 DIAGNOSIS — R7303 Prediabetes: Secondary | ICD-10-CM

## 2018-02-10 DIAGNOSIS — L03115 Cellulitis of right lower limb: Secondary | ICD-10-CM | POA: Diagnosis not present

## 2018-02-10 DIAGNOSIS — S8011XD Contusion of right lower leg, subsequent encounter: Secondary | ICD-10-CM | POA: Diagnosis not present

## 2018-02-10 DIAGNOSIS — R609 Edema, unspecified: Secondary | ICD-10-CM | POA: Diagnosis not present

## 2018-02-10 DIAGNOSIS — G4733 Obstructive sleep apnea (adult) (pediatric): Secondary | ICD-10-CM | POA: Diagnosis not present

## 2018-02-10 LAB — COMPREHENSIVE METABOLIC PANEL
ALT: 27 U/L (ref 0–53)
AST: 18 U/L (ref 0–37)
Albumin: 4 g/dL (ref 3.5–5.2)
Alkaline Phosphatase: 49 U/L (ref 39–117)
BUN: 15 mg/dL (ref 6–23)
CO2: 25 mEq/L (ref 19–32)
Calcium: 9.7 mg/dL (ref 8.4–10.5)
Chloride: 103 mEq/L (ref 96–112)
Creatinine, Ser: 0.93 mg/dL (ref 0.40–1.50)
GFR: 92.15 mL/min (ref >60.00)
Glucose, Bld: 108 mg/dL — ABNORMAL HIGH (ref 70–99)
Potassium: 4.3 mEq/L (ref 3.5–5.1)
Sodium: 138 mEq/L (ref 135–145)
Total Bilirubin: 0.3 mg/dL (ref 0.2–1.2)
Total Protein: 7.8 g/dL (ref 6.0–8.3)

## 2018-02-10 LAB — CBC WITH DIFFERENTIAL/PLATELET
Basophils Absolute: 0 10*3/uL (ref 0.0–0.1)
Basophils Relative: 0.4 % (ref 0.0–3.0)
Eosinophils Absolute: 0.2 10*3/uL (ref 0.0–0.7)
Eosinophils Relative: 2.8 % (ref 0.0–5.0)
HCT: 42.5 % (ref 39.0–52.0)
Hemoglobin: 14.3 g/dL (ref 13.0–17.0)
Lymphocytes Relative: 18.5 % (ref 12.0–46.0)
Lymphs Abs: 1.5 10*3/uL (ref 0.7–4.0)
MCHC: 33.6 g/dL (ref 30.0–36.0)
MCV: 87 fl (ref 78.0–100.0)
Monocytes Absolute: 0.7 10*3/uL (ref 0.1–1.0)
Monocytes Relative: 8.4 % (ref 3.0–12.0)
Neutro Abs: 5.7 10*3/uL (ref 1.4–7.7)
Neutrophils Relative %: 69.9 % (ref 43.0–77.0)
Platelets: 386 10*3/uL (ref 150.0–400.0)
RBC: 4.88 Mil/uL (ref 4.22–5.81)
RDW: 13.5 % (ref 11.5–15.5)
WBC: 8.1 10*3/uL (ref 4.0–10.5)

## 2018-02-10 LAB — HEMOGLOBIN A1C: Hgb A1c MFr Bld: 5.9 % (ref 4.6–6.5)

## 2018-02-10 LAB — TSH: TSH: 1.42 u[IU]/mL (ref 0.35–4.50)

## 2018-02-10 LAB — LDL CHOLESTEROL, DIRECT: Direct LDL: 93 mg/dL

## 2018-02-10 MED ORDER — DOXYCYCLINE HYCLATE 100 MG PO TABS: 100.0000 mg | ORAL_TABLET | Freq: Two times a day (BID) | ORAL | 0 refills | Status: DC

## 2018-02-10 NOTE — Progress Notes (Signed)
Subjective:  Patient ID: Stanley Fernandez, male    DOB: 1971-04-07  Age: 47 y.o. MRN: [id number]  CC: The primary encounter diagnosis was Cellulitis of right lower extremity. Diagnoses of Impaired fasting glucose, Edema, unspecified type, Sleep apnea, obstructive, Traumatic hematoma of right lower leg, subsequent encounter, and Prediabetes were also pertinent to this visit.  HPI Stanley Fernandez presents for follow up on ER visit last week for cellulitis/abscess of right calf.  History of traumatic hematoma occurred last year as a result of a fall through a deck on July 4th,  No break in skin , minor abrasions .  After a week of icing,  rednesss and swelling had not improved   Saw Dr Adriana Simas , diagnosed with seroma,  Referred to Dr Katrinka Blazing for aspiration  Which was done serially ,  Last one was in March and no re accumulation until one week ago developed fever, nausea and noticed that his distal leg was red and warm  By next mornign leg was hot.started taking doxycycline ,  Then saw Ayesha Mohair same day for multiple symptoms concerning for sepsis,  PE,  Recalls being delirious during ER visit, .    Sent TO ER by Dr Terrilee Files  On May 21 for evaluation with   Leukocytosis, LG fever,  Tachycardia and redness from wound to knee noted.  DVT and PE ruled out , Blood cultrues negative Prescribed doxycycline and cipro ,  Some nausea while on doxy , still taking cipr o  Seen by Katrinka Blazing on May 24,  fluid appeared to be re accumulating and plans to aspirate this week pending reolution of cellulitis   Went back to work today   Still taking cipro 500 mg bid.  Finished doxycycline 100 mg bid last night    2) recent episodes of left sided inguinal area now resolved   3) Obesity:  Has not been exercising regularly . 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    Outpatient Medications Prior to Visit  Medication Sig Dispense Refill  . acetaminophen (TYLENOL) 500 MG tablet Take 1,000 mg by mouth every 6 (six)  hours as needed.    . cetirizine (ZYRTEC) 10 MG tablet Take 10 mg by mouth daily.    . ciprofloxacin (CIPRO) 500 MG tablet Take 1 tablet (500 mg total) by mouth 2 (two) times daily. 20 tablet 0  . PRESCRIPTION MEDICATION Apply 1 application topically daily as needed. Shin    . traMADol (ULTRAM) 50 MG tablet Take 1 tablet (50 mg total) by mouth every 8 (eight) hours as needed. 30 tablet 0  . HYDROcodone-acetaminophen (NORCO) 5-325 MG tablet Take 1 tablet by mouth every 6 (six) hours as needed for moderate pain. (Patient not taking: Reported on 02/10/2018) 10 tablet 0  . doxycycline (VIBRAMYCIN) 100 MG capsule Take 100 mg by mouth daily at 12 noon.    Marland Kitchen oseltamivir (TAMIFLU) 75 MG capsule Take 1 capsule (75 mg total) by mouth 2 (two) times daily. (Patient not taking: Reported on 02/10/2018) 10 capsule 0   No facility-administered medications prior to visit.     Review of Systems;  Patient denies headache, fevers, malaise, unintentional weight loss, skin rash, eye pain, sinus congestion and sinus pain, sore throat, dysphagia,  hemoptysis , cough, dyspnea, wheezing, chest pain, palpitations, orthopnea, edema, abdominal pain, nausea, melena, diarrhea, constipation, flank pain, dysuria, hematuria, urinary  Frequency, nocturia, numbness, tingling, seizures,  Focal weakness, Loss of consciousness,  Tremor, insomnia, depression, anxiety,  and suicidal ideation.      Objective:  BP 132/82 (BP Location: Left Arm, Patient Position: Sitting, Cuff Size: Large)   Pulse 99   Temp 98.2 F (36.8 C) (Oral)   Resp 17   Ht  (1.753 m)   Wt (!) 356 lb 9.6 oz (161.8 kg)   SpO2 98%   BMI 52.66 kg/m   BP Readings from Last 3 Encounters:  02/10/18 132/82  02/06/18 132/86  02/03/18 (!) 158/83    Wt Readings from Last 3 Encounters:  02/10/18 (!) 356 lb 9.6 oz (161.8 kg)  02/06/18 (!) 350 lb (158.8 kg)  02/03/18 (!) 361 lb (163.7 kg)    General appearance: alert, cooperative and appears stated  age Ears: normal TM's and external ear canals both ears Throat: lips, mucosa, and tongue normal; teeth and gums normal Neck: no adenopathy, no carotid bruit, supple, symmetrical, trachea midline and thyroid not enlarged, symmetric, no tenderness/mass/nodules Back: symmetric, no curvature. ROM normal. No CVA tenderness. Lungs: clear to auscultation bilaterally Heart: regular rate and rhythm, S1, S2 normal, no murmur, click, rub or gallop Abdomen: soft, non-tender; bowel sounds normal; no masses,  no organomegaly Pulses: 2+ and symmetric Skin: Right calf tense , swollen.  Venous stasis changes to lower calf .  turgor normal. No rashes or lesions Lymph nodes: Cervical, supraclavicular, and axillary nodes normal.  Lab Results  Component Value Date   HGBA1C 5.9 02/10/2018   HGBA1C 6.1 01/21/2013    Lab Results  Component Value Date   CREATININE 0.93 02/10/2018   CREATININE 0.94 02/03/2018   CREATININE 0.87 06/19/2015    Lab Results  Component Value Date   WBC 8.1 02/10/2018   HGB 14.3 02/10/2018   HCT 42.5 02/10/2018   PLT 386.0 02/10/2018   GLUCOSE 108 (H) 02/10/2018   CHOL 143 06/19/2015   TRIG 157.0 (H) 06/19/2015   HDL 44.10 06/19/2015   LDLDIRECT 93.0 02/10/2018   LDLCALC 68 06/19/2015   ALT 27 02/10/2018   AST 18 02/10/2018   NA 138 02/10/2018   K 4.3 02/10/2018   CL 103 02/10/2018   CREATININE 0.93 02/10/2018   BUN 15 02/10/2018   CO2 25 02/10/2018   TSH 1.42 02/10/2018   HGBA1C 5.9 02/10/2018   MICROALBUR 1.3 08/03/2012    Dg Chest 2 View  Result Date: 02/03/2018 CLINICAL DATA:  Shortness of breath. EXAM: CHEST - 2 VIEW COMPARISON:  None. FINDINGS: Poor inspiratory effort. Heart size upper limits normal. Slight vascular congestion. No consolidation or edema. No effusion or pneumothorax. Mild degenerative change thoracic spine. IMPRESSION: Low lung volumes. Borderline cardiac enlargement. Mild vascular congestion. Electronically Signed   By: Elsie Stain M.D.    On: 02/03/2018 12:15   Ct Angio Chest Pe W And/or Wo Contrast  Result Date: 02/03/2018 CLINICAL DATA:  Shortness of breath EXAM: CT ANGIOGRAPHY CHEST WITH CONTRAST TECHNIQUE: Multidetector CT imaging of the chest was performed using the standard protocol during bolus administration of intravenous contrast. Multiplanar CT image reconstructions and MIPs were obtained to evaluate the vascular anatomy. CONTRAST:  ISOVUE-370 IOPAMIDOL (ISOVUE-370) INJECTION 76% COMPARISON:  Chest radiograph Feb 03, 2018 FINDINGS: Cardiovascular: There is no demonstrable pulmonary embolus. There is no appreciable thoracic aortic aneurysm or dissection. The visualized great vessels appear normal. There is no appreciable pericardial effusion or pericardial thickening. Mediastinum/Nodes: Thyroid appears unremarkable. There is moderate anterior mediastinal fat. There is no appreciable thoracic adenopathy. No esophageal lesions are evident. Lungs/Pleura: There is no lung edema or consolidation.  No pleural effusion or pleural thickening evident. Upper Abdomen: There is hepatic steatosis. Visualized upper abdominal structures otherwise appear unremarkable. Musculoskeletal: There is degenerative change in the thoracic spine. There are no blastic or lytic bone lesions. No chest wall lesions are evident. Review of the MIP images confirms the above findings. IMPRESSION: 1. No demonstrable pulmonary embolus. No thoracic aortic aneurysm or dissection. 2.  No lung edema or consolidation. 3.  No evident thoracic adenopathy. 4.  Hepatic steatosis. Electronically Signed   By: Bretta Bang Fernandez M.D.   On: 02/03/2018 18:01    Assessment & Plan:   Problem List Items Addressed This Visit    Sleep apnea, obstructive    Managed by Dr Jenne Campus .  Wearing mask  Diagnosed by sleep study. She is wearing her CPAP every night a minimum of 6 hours per night and notes improved daytime wakefulness and decreased fatigue  Owns his own machine.  Feeling  great was first test.  Last test was done in [facility]  2 years ago       Traumatic hematoma of right lower leg    He has had recurrence of intramuscular fluid accumulation requiring repeated aspirations  And now cellulitis (spontaneous) .  Recommending referral to Vascular Surgery to evaluate his circulation        Prediabetes    His random glucose is again elevated but not diagnostic of diabetes .  I recommend he follow a low glycemic index diet and particpate regularly in an aerobic  exercise activity.  We should check an A1c in 3 months.   Lab Results  Component Value Date   HGBA1C 5.9 02/10/2018         Cellulitis - Primary    ER records reviewed.  I am Refilling doxycycline for continued treatment given recurrence of swelling and relative warmth of right calf  discussed referral to vascular srgery      Relevant Orders   CBC with Differential/Platelet (Completed)    Other Visit Diagnoses    Impaired fasting glucose       Relevant Orders   Hemoglobin A1c (Completed)   Comprehensive metabolic panel (Completed)   LDL cholesterol, direct (Completed)   Edema, unspecified type       Relevant Orders   TSH (Completed)      I have discontinued Stanley Noa B. Willers Fernandez "Boyce"'s oseltamivir and doxycycline. I am also having him start on doxycycline. Additionally, I am having him maintain his cetirizine, traMADol, acetaminophen, PRESCRIPTION MEDICATION, ciprofloxacin, and HYDROcodone-acetaminophen.  Meds ordered this encounter  Medications  . doxycycline (VIBRA-TABS) 100 MG tablet    Sig: Take 1 tablet (100 mg total) by mouth 2 (two) times daily.    Dispense:  20 tablet    Refill:  0    Medications Discontinued During This Encounter  Medication Reason  . doxycycline (VIBRAMYCIN) 100 MG capsule Completed Course  . oseltamivir (TAMIFLU) 75 MG capsule Completed Course    Follow-up: No follow-ups on file.   Sherlene Shams, MD

## 2018-02-10 NOTE — Assessment & Plan Note (Signed)
Managed by Dr Jenne Campus .  Wearing mask  Diagnosed by sleep study. She is wearing her CPAP every night a minimum of 6 hours per night and notes improved daytime wakefulness and decreased fatigue  Owns his own machine.  Feeling great was first test.  Last test was done in [facility]  2 years ago

## 2018-02-10 NOTE — Patient Instructions (Addendum)
I recommend resuming doxycycline and continuing ciprofloxacin  Repeating your CBC today along with labs to rule out diabets   Keep your appointment  with Dr. Katrinka Blazing on Thursday and discuss whether vascular evaluation would be helpful to rule out lymphedema and venous insufficiency   Daily use of a probiotic is advised for 3 weeks.

## 2018-02-11 DIAGNOSIS — R7303 Prediabetes: Secondary | ICD-10-CM | POA: Insufficient documentation

## 2018-02-11 NOTE — Assessment & Plan Note (Signed)
He has had recurrence of intramuscular fluid accumulation requiring repeated aspirations  And now cellulitis (spontaneous) .  Recommending referral to Vascular Surgery to evaluate his circulation

## 2018-02-11 NOTE — Assessment & Plan Note (Signed)
ER records reviewed.  I am Refilling doxycycline for continued treatment given recurrence of swelling and relative warmth of right calf  discussed referral to vascular srgery

## 2018-02-11 NOTE — Assessment & Plan Note (Signed)
His random glucose is again elevated but not diagnostic of diabetes .  I recommend he follow a low glycemic index diet and particpate regularly in an aerobic  exercise activity.  We should check an A1c in 3 months.   Lab Results  Component Value Date   HGBA1C 5.9 02/10/2018

## 2018-02-11 NOTE — Progress Notes (Signed)
[name] [address] [place], [place] [address] Phone: [phone] Subjective:    I'm seeing this patient by the request  of:    CC: Right leg cellulitis  [id number]  Stanley Fernandez is a 47 y.o. male coming in with complaint of right leg cellulitis follow-up.  Patient was seen previously.  Please see previous notes.  Saw primary care provider who did discuss with him and we are going to extend doxycycline by 10 days.  States that the redness feels better but still has a fullness and tightness.  Feels more like the hematoma/seroma accumulation that was noted at last exam.  Patient denies any fevers any chills.  States that the redness is improving.  Has been doing lotion on the leg which has been helping with some of the break in the skin's.     Past Medical History:  Diagnosis Date  . allergic rhinitis    managed with antihistamine  . Bright's disease   . Chicken pox   . Hypertension   . Obesity (BMI 30-39.9)   . Sleep apnea, obstructive    last study 2009   Past Surgical History:  Procedure Laterality Date  . FINGER NAIL SURGERY    . MEDIAL COLLATERAL LIGAMENT AND LATERAL COLLATERAL LIGAMENT REPAIR, KNEE  2007  . TONSILLECTOMY AND ADENOIDECTOMY  2004   Social History   Socioeconomic History  . Marital status: Married    Spouse name: Not on file  . Number of children: Not on file  . Years of education: Not on file  . Highest education level: Not on file  Occupational History  . Not on file  Social Needs  . Financial resource strain: Not on file  . Food insecurity:    Worry: Not on file    Inability: Not on file  . Transportation needs:    Medical: Not on file    Non-medical: Not on file  Tobacco Use  . Smoking status: Never Smoker  . Smokeless tobacco: Former Engineer, water and Sexual Activity  . Alcohol use: Yes    Comment: rare  . Drug use: No  . Sexual activity: Not on file  Lifestyle  . Physical activity:   Days per week: Not on file    Minutes per session: Not on file  . Stress: Not on file  Relationships  . Social connections:    Talks on phone: Not on file    Gets together: Not on file    Attends religious service: Not on file    Active member of club or organization: Not on file    Attends meetings of clubs or organizations: Not on file    Relationship status: Not on file  Other Topics Concern  . Not on file  Social History Narrative  . Not on file   No Known Allergies Family History  Problem Relation Age of Onset  . Cancer Mother 81       breast, mets to brain   . Heart disease Father 39       AMI  . Heart attack Father      Past medical history, social, surgical and family history all reviewed in electronic medical record.  No pertanent information unless stated regarding to the chief complaint.   Review of Systems:Review of systems updated and as accurate as of 02/12/18  No headache, visual changes, nausea, vomiting, diarrhea, constipation, dizziness, abdominal pain, skin rash, fevers, chills, night sweats, weight loss, swollen  lymph nodes, body aches, joint swelling,, chest pain, shortness of breath, mood changes.  Positive muscle aches and lower leg swelling  Objective  Blood pressure 128/60, pulse 95, height  (1.753 m), weight (!) 355 lb (161 kg), SpO2 98 %. Systems examined below as of 02/12/18   General: No apparent distress alert and oriented x3 mood and affect normal, dressed appropriately.  HEENT: Pupils equal, extraocular movements intact  Respiratory: Patient's speak in full sentences and does not appear short of breath  Cardiovascular: 2+ lower extremity edema, non tender, no erythema  Skin: Warm dry intact with no signs of infection or rash on extremities or on axial skeleton.  Abdomen: Soft nontender  Neuro: Cranial nerves II through XII are intact, neurovascularly intact in all extremities with 2+ DTRs and 2+ pulses.  Lymph: No lymphadenopathy of  posterior or anterior cervical chain or axillae bilaterally.  Gait antalgic MSK:  Non tender with full range of motion and good stability andbilaterally.  Patient's right lower extremity and does have some increase in diameter of the calf even from previous exam.  Patient though cellulitis is significantly improved with significant less erythema and warmth to touch.  Patient's anterior aspect of the skin also has less pertinent skin.  Patient does have 2+ pitting edema though of the soft tissue.   Procedure: Real-time Ultrasound Guided Injection of right seroma of the cath aspiration Device: GE Logiq Q7 Ultrasound guided injection is preferred based studies that show increased duration, increased effect, greater accuracy, decreased procedural pain, increased response rate, and decreased cost with ultrasound guided versus blind injection.  Verbal informed consent obtained.  Time-out conducted.  Noted no overlying erythema, induration, or other signs of local infection.  Skin prepped in a sterile fashion.  Local anesthesia: Topical Ethyl chloride.  With sterile technique and under real time ultrasound guidance: With an 18-gauge 1-1/2 inch needle patient was injected with 2 cc of 0.5% Marcaine and then aspirated 70 cc of mostly strawlike color fluid with some mild blood.  No pus or infectious etiology noted. Completed without difficulty  Pain immediately resolved suggesting accurate placement of the medication.  Advised to call if fevers/chills, erythema, induration, drainage, or persistent bleeding.  Images permanently stored and available for review in the ultrasound unit.  Impression: Technically successful ultrasound guided injection.    Impression and Recommendations:     This case required medical decision making of moderate complexity.      Note: This dictation was prepared with Dragon dictation along with smaller phrase technology. Any transcriptional errors that result from this  process are unintentional.

## 2018-02-12 ENCOUNTER — Other Ambulatory Visit: Payer: BLUE CROSS/BLUE SHIELD

## 2018-02-12 ENCOUNTER — Encounter: Payer: Self-pay | Admitting: Family Medicine

## 2018-02-12 ENCOUNTER — Ambulatory Visit (INDEPENDENT_AMBULATORY_CARE_PROVIDER_SITE_OTHER): Payer: BLUE CROSS/BLUE SHIELD | Admitting: Family Medicine

## 2018-02-12 ENCOUNTER — Ambulatory Visit: Payer: Self-pay

## 2018-02-12 VITALS — BP 128/60 | HR 95 | Ht 69.0 in | Wt 355.0 lb

## 2018-02-12 DIAGNOSIS — R600 Localized edema: Secondary | ICD-10-CM | POA: Diagnosis not present

## 2018-02-12 DIAGNOSIS — M79604 Pain in right leg: Secondary | ICD-10-CM | POA: Diagnosis not present

## 2018-02-12 DIAGNOSIS — M79605 Pain in left leg: Secondary | ICD-10-CM

## 2018-02-12 DIAGNOSIS — L03115 Cellulitis of right lower limb: Secondary | ICD-10-CM

## 2018-02-12 DIAGNOSIS — S8011XD Contusion of right lower leg, subsequent encounter: Secondary | ICD-10-CM | POA: Diagnosis not present

## 2018-02-12 MED ORDER — FUROSEMIDE 20 MG PO TABS: 20.0000 mg | ORAL_TABLET | Freq: Every day | ORAL | 3 refills | Status: DC

## 2018-02-12 MED ORDER — CIPROFLOXACIN HCL 500 MG PO TABS: 500.0000 mg | ORAL_TABLET | Freq: Two times a day (BID) | ORAL | 0 refills | Status: DC

## 2018-02-12 NOTE — Assessment & Plan Note (Signed)
Short short course of Lasix given.

## 2018-02-12 NOTE — Progress Notes (Signed)
[name] [address] [place], [place] [address] Phone: [phone] Subjective:      CC: Right leg pain follow-up  [id number]  Stanley Fernandez is a 47 y.o. male coming in with complaint of right leg pain.  Had a cellulitis.  Recurrent hematoma also noted.  Had aspiration done at last visit.  States that feeling relatively good.  Continues to have 1 more week of anti-inflammatories as well as antibiotics.  States that when he feels like he is moving in the right direction but continues to have swelling of the calf.  Wants something more long-term.     Past Medical History:  Diagnosis Date  . allergic rhinitis    managed with antihistamine  . Bright's disease   . Chicken pox   . Hypertension   . Obesity (BMI 30-39.9)   . Sleep apnea, obstructive    last study 2009   Past Surgical History:  Procedure Laterality Date  . FINGER NAIL SURGERY    . MEDIAL COLLATERAL LIGAMENT AND LATERAL COLLATERAL LIGAMENT REPAIR, KNEE  2007  . TONSILLECTOMY AND ADENOIDECTOMY  2004   Social History   Socioeconomic History  . Marital status: Married    Spouse name: Not on file  . Number of children: Not on file  . Years of education: Not on file  . Highest education level: Not on file  Occupational History  . Not on file  Social Needs  . Financial resource strain: Not on file  . Food insecurity:    Worry: Not on file    Inability: Not on file  . Transportation needs:    Medical: Not on file    Non-medical: Not on file  Tobacco Use  . Smoking status: Never Smoker  . Smokeless tobacco: Former Engineer, water and Sexual Activity  . Alcohol use: Yes    Comment: rare  . Drug use: No  . Sexual activity: Not on file  Lifestyle  . Physical activity:    Days per week: Not on file    Minutes per session: Not on file  . Stress: Not on file  Relationships  . Social connections:    Talks on phone: Not on file    Gets together: Not on file    Attends  religious service: Not on file    Active member of club or organization: Not on file    Attends meetings of clubs or organizations: Not on file    Relationship status: Not on file  Other Topics Concern  . Not on file  Social History Narrative  . Not on file   No Known Allergies Family History  Problem Relation Age of Onset  . Cancer Mother 50       breast, mets to brain   . Heart disease Father 41       AMI  . Heart attack Father      Past medical history, social, surgical and family history all reviewed in electronic medical record.  No pertanent information unless stated regarding to the chief complaint.   Review of Systems:Review of systems updated and as accurate as of 02/16/18  No headache, visual changes, nausea, vomiting, diarrhea, constipation, dizziness, abdominal pain, skin rash, fevers, chills, night sweats, weight loss, swollen lymph nodes, body aches, joint swelling,, chest pain, shortness of breath, mood changes.  Mild positive muscle aches  Objective  Blood pressure 140/90, pulse 82, height  (1.753 m), weight (!) 351 lb (159.2 kg),  SpO2 96 %. Systems examined below as of 02/16/18   General: No apparent distress alert and oriented x3 mood and affect normal, dressed appropriately.  HEENT: Pupils equal, extraocular movements intact  Respiratory: Patient's speak in full sentences and does not appear short of breath  Cardiovascular: No lower extremity edema, non tender, no erythema  Skin: Warm dry intact with no signs of infection or rash on extremities or on axial skeleton.  Abdomen: Soft nontender  Neuro: Cranial nerves II through XII are intact, neurovascularly intact in all extremities with 2+ DTRs and 2+ pulses.  Lymph: No lymphadenopathy of posterior or anterior cervical chain or axillae bilaterally.  Gait mild antalgic MSK:  Non tender with full range of motion and good stability and symmetric strength and tone of shoulders, elbows, wrist, hip, knee and  ankles bilaterally.   Right calf is smaller than previous exam but still large compared to the contralateral side.  Patient still has some mild tenderness there.  Cellulitis that was noted previously is improved.  No longer warm to touch.  Breakdown of the skin of the superficial layer seems to be beneficially improving as well.   Impression and Recommendations:     This case required medical decision making of moderate complexity.      Note: This dictation was prepared with Dragon dictation along with smaller phrase technology. Any transcriptional errors that result from this process are unintentional.       ;4

## 2018-02-12 NOTE — Assessment & Plan Note (Signed)
Patient had aspiration done today.  Sent the fluid for culture to make sure that there is no infection etiology unit.  Discussed with him that we can continue with the serial aspirations and he has been seeming to make improvements over the course of time.

## 2018-02-12 NOTE — Patient Instructions (Signed)
Good to see you  Stanley Fernandez is your friend.  Try to leave it wrap today if not too tight  I will get back to you after tonight and see what to do next  See me again in 2-3 weeks

## 2018-02-12 NOTE — Assessment & Plan Note (Signed)
Seems to be resolving.  Due to the intervention that was done today though I would like patient to be on the doxycycline as well as the ciprofloxacin.  We did go through an area that had no erythema or redness noted.  Aspiration did show some dark color fluid and did sent down to lab for further evaluation and will do culture in case.  There is no sign of any fascial irritation now.  I believe that the cellulitis and the seroma are 2 different entities.  Follow-up with me again 4 weeks

## 2018-02-13 LAB — SYNOVIAL CELL COUNT + DIFF, W/ CRYSTALS
Basophils, %: 0 %
Eosinophils-Synovial: 0 % (ref 0–2)
Lymphocytes-Synovial Fld: 8 % (ref 0–74)
Monocyte/Macrophage: 3 % (ref 0–69)
Neutrophil, Synovial: 89 % — ABNORMAL HIGH (ref 0–24)
Synoviocytes, %: 0 % (ref 0–15)
WBC, Synovial: 583 cells/uL — ABNORMAL HIGH (ref <150)

## 2018-02-16 ENCOUNTER — Ambulatory Visit (INDEPENDENT_AMBULATORY_CARE_PROVIDER_SITE_OTHER): Payer: BLUE CROSS/BLUE SHIELD | Admitting: Family Medicine

## 2018-02-16 ENCOUNTER — Encounter: Payer: Self-pay | Admitting: Family Medicine

## 2018-02-16 VITALS — BP 140/90 | HR 82 | Ht 69.0 in | Wt 351.0 lb

## 2018-02-16 DIAGNOSIS — M79604 Pain in right leg: Secondary | ICD-10-CM

## 2018-02-16 DIAGNOSIS — S8011XD Contusion of right lower leg, subsequent encounter: Secondary | ICD-10-CM | POA: Diagnosis not present

## 2018-02-16 LAB — BODY FLUID CULTURE

## 2018-02-16 NOTE — Assessment & Plan Note (Signed)
Has had recurrent hematoma multiple occasions.  Discussed with patient in great length.  70 cc was removed at last visit.  We discussed with patient continued compression will be referred to orthopedic surgery for the possibility of drain placement.  Discussed this with Dr. Luiz BlareGraves previously.  Patient will try this and follow-up with me again in 4 weeks

## 2018-02-16 NOTE — Patient Instructions (Signed)
Always good to see you  Continue the compression  Lasix for 5 days then stop, more swelling restart for 5 days  Continue the antibiotics Get the test to check the blood pressure [facility] with Dr. Luiz BlareGraves [address]  [phone] I am here if you have questoins

## 2018-02-19 ENCOUNTER — Ambulatory Visit (HOSPITAL_COMMUNITY)
Admission: RE | Admit: 2018-02-19 | Discharge: 2018-02-19 | Disposition: A | Discharge status: Home / Self Care | Payer: BLUE CROSS/BLUE SHIELD | Source: Ambulatory Visit | Attending: Cardiology | Admitting: Cardiology

## 2018-02-19 DIAGNOSIS — I10 Essential (primary) hypertension: Secondary | ICD-10-CM | POA: Diagnosis not present

## 2018-02-19 DIAGNOSIS — M79605 Pain in left leg: Secondary | ICD-10-CM | POA: Diagnosis not present

## 2018-02-19 DIAGNOSIS — M79604 Pain in right leg: Secondary | ICD-10-CM | POA: Diagnosis not present

## 2018-02-19 DIAGNOSIS — E119 Type 2 diabetes mellitus without complications: Secondary | ICD-10-CM | POA: Insufficient documentation

## 2019-02-26 ENCOUNTER — Other Ambulatory Visit: Payer: Self-pay

## 2019-02-26 ENCOUNTER — Other Ambulatory Visit: Payer: BLUE CROSS/BLUE SHIELD

## 2019-02-26 DIAGNOSIS — Z20822 Contact with and (suspected) exposure to covid-19: Secondary | ICD-10-CM

## 2019-02-28 LAB — NOVEL CORONAVIRUS, NAA: SARS-CoV-2, NAA: NOT DETECTED

## 2019-09-13 ENCOUNTER — Other Ambulatory Visit: Payer: Self-pay

## 2019-09-13 ENCOUNTER — Encounter: Payer: Self-pay | Admitting: Internal Medicine

## 2019-09-13 ENCOUNTER — Ambulatory Visit (INDEPENDENT_AMBULATORY_CARE_PROVIDER_SITE_OTHER): Payer: BC Managed Care – PPO | Admitting: Internal Medicine

## 2019-09-13 VITALS — Ht 69.0 in | Wt 365.0 lb

## 2019-09-13 DIAGNOSIS — G8929 Other chronic pain: Secondary | ICD-10-CM

## 2019-09-13 DIAGNOSIS — G4733 Obstructive sleep apnea (adult) (pediatric): Secondary | ICD-10-CM

## 2019-09-13 DIAGNOSIS — R7303 Prediabetes: Secondary | ICD-10-CM

## 2019-09-13 DIAGNOSIS — R600 Localized edema: Secondary | ICD-10-CM

## 2019-09-13 DIAGNOSIS — R030 Elevated blood-pressure reading, without diagnosis of hypertension: Secondary | ICD-10-CM | POA: Diagnosis not present

## 2019-09-13 DIAGNOSIS — M25511 Pain in right shoulder: Secondary | ICD-10-CM

## 2019-09-13 DIAGNOSIS — Z0000 Encounter for general adult medical examination without abnormal findings: Secondary | ICD-10-CM | POA: Insufficient documentation

## 2019-09-13 NOTE — Assessment & Plan Note (Signed)
Managed by Dr Tami Ribas .  Wearing mask  Diagnosed by sleep study. he is wearing her CPAP every night a minimum of 6 hours per night and notes improved daytime wakefulness and decreased fatigue  Owns his own machine.  Feeling great was first test.  Last test was done in [place]  2 years ago

## 2019-09-13 NOTE — Assessment & Plan Note (Signed)
His random glucose was elevated but not diagnostic of diabetes .  I recommended that  he follow a low glycemic index diet and particpate regularly in an aerobic exercise activity.    Lab Results  Component Value Date   HGBA1C 5.9 02/10/2018

## 2019-09-13 NOTE — Assessment & Plan Note (Signed)
With recent exacerbation.  He has deferred sports medicine referral.

## 2019-09-13 NOTE — Assessment & Plan Note (Addendum)
Readings have been elevated on several visits.  RN VISIT NEEDED TO CHECK BP

## 2019-09-13 NOTE — Progress Notes (Signed)
Virtual Visit via [email or web address]  This visit type was conducted due to national recommendations for restrictions regarding the COVID-19 pandemic (e.g. social distancing).  This format is felt to be most appropriate for this patient at this time.  All issues noted in this document were discussed and addressed.  No physical exam was performed (except for noted visual exam findings with Video Visits).   I connected with@ on 09/13/19 at  8:30 AM EST by a video enabled telemedicine application and verified that I am speaking with the correct person using two identifiers. Location patient: home Location provider: work or home office Persons participating in the virtual visit: patient, provider  I discussed the limitations, risks, security and privacy concerns of performing an evaluation and management service by telephone and the availability of in person appointments. I also discussed with the patient that there may be a patient responsible charge related to this service. The patient expressed understanding and agreed to proceed.  Reason for visit: annual CPE   HPI:  48 yr old male last seen May 2019, here for CPE.  Elevated BP last recorded reading 140/90  Patient ID: Lysbeth PennerWilliam B Gerardo III, male    DOB: 04/26/1971  Age: 48 y.o. MRN: [id number]  The patient is here for annual preventive  examination and management of other chronic and acute problems.   The risk factors are reflected in the social history.  The roster of all physicians providing medical care to patient - is listed in the Snapshot section of the chart.  Activities of daily living:  The patient is 100% independent in all ADLs: dressing, toileting, feeding as well as independent mobility  Home safety : The patient has smoke detectors in the home. They wear seatbelts.  There are no firearms at home. There is no violence in the home.   There is no risks for hepatitis, STDs or HIV. There is no   history of blood transfusion. They have no  travel history to infectious disease endemic areas of the world.  The patient has seen their dentist in the last six month. They have seen their eye doctor in the last year. They deny slight hearing difficulty with regard to whispered voices and some television programs.  They have deferred audiologic testing in the last year.  They do not  have excessive sun exposure. Discussed the need for sun protection: hats, long sleeves and use of sunscreen if there is significant sun exposure.   Diet: the importance of a healthy diet is discussed. They do not  have a healthy diet.  The benefits of regular aerobic exercise were discussed. He Is not exercising .   Depression screen: there are no signs or vegative symptoms of depression- irritability, change in appetite, anhedonia, sadness/tearfullness.   The following portions of the patient's history were reviewed and updated as appropriate: allergies, current medications, past family history, past medical history,  past surgical history, past social history  and problem list.  Visual acuity was not assessed per patient preference since she has regular follow up with her ophthalmologist. Hearing and body mass index were assessed and reviewed.   During the course of the visit the patient was educated and counseled about appropriate screening and preventive services including : fall prevention , diabetes screening, nutrition counseling, colorectal cancer screening, and recommended immunizations.    CC: The primary encounter diagnosis was Blood pressure elevated without history of HTN. Diagnoses of Prediabetes, Lower extremity edema, Morbid obesity (HCC), Chronic right shoulder pain, Sleep apnea,  obstructive, and Encounter for preventive health examination were also pertinent to this visit.  1) Obesity with OSA and dyspnea . Weight up 14 lbs since June 2019 OSA  using CPAP every single night Prediabetes  2) Bilateral LE edema .  Remote surgery on left knee  (ACL repair, etc)  has mild edema on left ,  But since his trauma in 2019 to right leg with large hematoma he has been having recurrent right calf edema   3)  Change in BM:  Used to have one formed BM daily .  Now having  3 every morning, about 15 minutes apart.  Stools are formed but sometimes  .  No histor of trauma. soesmall caliber   4) right  shoulder pain  Along the clavicle aggravated by deep breaths,  Abduction of arm . Some overuse history,  No trauma.    History Khary has a past medical history of allergic rhinitis, Bright's disease, Chicken pox, Hypertension, Obesity (BMI 30-39.9), Sleep apnea, obstructive, and Traumatic hematoma of right lower leg (04/08/2017).   He has a past surgical history that includes Medial collateral ligament and lateral collateral ligament repair, knee (2007); Tonsillectomy and adenoidectomy (2004); and Finger nail surgery.   His family history includes Cancer (age of onset: 40) in his mother; Heart attack in his father; Heart disease (age of onset: 96) in his father.He reports that he has never smoked. He quit smokeless tobacco use about 17 years ago. He reports current alcohol use. He reports that he does not use drugs.     ROS: See pertinent positives and negatives per HPI.  Past Medical History:  Diagnosis Date  . allergic rhinitis    managed with antihistamine  . Bright's disease   . Chicken pox   . Hypertension   . Obesity (BMI 30-39.9)   . Sleep apnea, obstructive    last study 2009  . Traumatic hematoma of right lower leg 04/08/2017   Repeat aspiration October 03, 1998 1970 cc Repeat aspiration Feb 13, 1999 1970 cc    Past Surgical History:  Procedure Laterality Date  . FINGER NAIL SURGERY    . MEDIAL COLLATERAL LIGAMENT AND LATERAL COLLATERAL LIGAMENT REPAIR, KNEE  2007  . TONSILLECTOMY AND ADENOIDECTOMY  2004    Family History  Problem Relation Age of Onset  . Cancer Mother 36       breast, mets to brain   . Heart disease  Father 61       AMI  . Heart attack Father     SOCIAL HX:  reports that he has never smoked. He quit smokeless tobacco use about 17 years ago. He reports current alcohol use. He reports that he does not use drugs.   Current Outpatient Medications:  .  acetaminophen (TYLENOL) 500 MG tablet, Take 1,000 mg by mouth every 6 (six) hours as needed., Disp: , Rfl:  .  cetirizine (ZYRTEC) 10 MG tablet, Take 10 mg by mouth daily., Disp: , Rfl:  .  cholecalciferol (VITAMIN D3) 25 MCG (1000 UT) tablet, Take 1,000 Units by mouth daily., Disp: , Rfl:  .  Omega-3 Fatty Acids (FISH OIL) 600 MG CAPS, Take 1 capsule by mouth 2 (two) times daily., Disp: , Rfl:  .  vitamin B-12 (CYANOCOBALAMIN) 1000 MCG tablet, Take 1,000 mcg by mouth daily., Disp: , Rfl:  .  PRESCRIPTION MEDICATION, Apply 1 application topically daily as needed. Evette Cristal, Disp: , Rfl:   EXAM:  VITALS per patient if applicable:  GENERAL: alert,  oriented, appears well and in no acute distress  HEENT: atraumatic, conjunttiva clear, no obvious abnormalities on inspection of external nose and ears  NECK: normal movements of the head and neck  LUNGS: on inspection no signs of respiratory distress, breathing rate appears normal, no obvious gross SOB, gasping or wheezing  CV: no obvious cyanosis  MS: moves all visible extremities without noticeable abnormality  PSYCH/NEURO: pleasant and cooperative, no obvious depression or anxiety, speech and thought processing grossly intact  ASSESSMENT AND PLAN:  Discussed the following assessment and plan:  Blood pressure elevated without history of HTN - Plan: Microalbumin / creatinine urine ratio  Prediabetes - Plan: Comprehensive metabolic panel, Hemoglobin A1c, Lipid panel  Lower extremity edema - Plan: CBC with Differential/Platelet, Ambulatory referral to Vascular Surgery  Morbid obesity ([place]) - Plan: TSH  Chronic right shoulder pain  Sleep apnea, obstructive  Encounter for preventive  health examination  Blood pressure elevated without history of HTN Readings have been elevated on several visits.  RN VISIT NEEDED TO CHECK BP   Chronic right shoulder pain With recent exacerbation.  He has deferred sports medicine referral.   Lower extremity edema Referral to [facility] for vascular eval.  Likely multifactorial : OSA. VI,    Sleep apnea, obstructive Managed by Dr Tami Ribas .  Wearing mask  Diagnosed by sleep study. he is wearing her CPAP every night a minimum of 6 hours per night and notes improved daytime wakefulness and decreased fatigue  Owns his own machine.  Feeling great was first test.  Last test was done in [place]  2 years ago   Morbid obesity I have addressed  BMI and recommended wt loss of 10% of body weight over the next 6 months using a low glycemic index diet and regular exercise a minimum of 5 days per week.    Encounter for preventive health examination age appropriate education and counseling updated, referrals for preventative services and immunizations addressed, dietary and smoking counseling addressed, most recent labs reviewed.  I have personally reviewed and have noted:  1) the patient's medical and social history 2) The pt's use of alcohol, tobacco, and illicit drugs 3) The patient's current medications and supplements 4) Functional ability including ADL's, fall risk, home safety risk, hearing and visual impairment 5) Diet and physical activities 6) Evidence for depression or mood disorder 7) The patient's height, weight, and BMI have been recorded in the chart  I have made referrals, and provided counseling and education based on review of the above  Prediabetes His random glucose was elevated but not diagnostic of diabetes .  I recommended that  he follow a low glycemic index diet and particpate regularly in an aerobic exercise activity.    Lab Results  Component Value Date   HGBA1C 5.9 02/10/2018       I discussed the assessment and  treatment plan with the patient. The patient was provided an opportunity to ask questions and all were answered. The patient agreed with the plan and demonstrated an understanding of the instructions.   The patient was advised to call back or seek an in-person evaluation if the symptoms worsen or if the condition fails to improve as anticipated.    Crecencio Mc, MD

## 2019-09-13 NOTE — Assessment & Plan Note (Signed)

## 2019-09-13 NOTE — Assessment & Plan Note (Signed)
I have addressed  BMI and recommended wt loss of 10% of body weight over the next 6 months using a low glycemic index diet and regular exercise a minimum of 5 days per week.   

## 2019-09-13 NOTE — Assessment & Plan Note (Signed)
Referral to [facility] for vascular eval.  Likely multifactorial : OSA. VI,

## 2019-09-22 ENCOUNTER — Ambulatory Visit: Payer: BC Managed Care – PPO | Admitting: Internal Medicine

## 2019-09-22 ENCOUNTER — Other Ambulatory Visit: Payer: BC Managed Care – PPO

## 2019-09-22 ENCOUNTER — Other Ambulatory Visit (INDEPENDENT_AMBULATORY_CARE_PROVIDER_SITE_OTHER): Payer: BC Managed Care – PPO

## 2019-09-22 ENCOUNTER — Ambulatory Visit: Payer: BC Managed Care – PPO

## 2019-09-22 ENCOUNTER — Other Ambulatory Visit: Payer: Self-pay

## 2019-09-22 DIAGNOSIS — R7303 Prediabetes: Secondary | ICD-10-CM | POA: Diagnosis not present

## 2019-09-22 DIAGNOSIS — R030 Elevated blood-pressure reading, without diagnosis of hypertension: Secondary | ICD-10-CM | POA: Diagnosis not present

## 2019-09-22 DIAGNOSIS — R600 Localized edema: Secondary | ICD-10-CM

## 2019-09-22 NOTE — Progress Notes (Signed)
Patient here for nurse visit BP check and Weight check  per order from Dr. Darrick Huntsman.  Patient is not on medication.   Last dose of BP medication:   BP Readings from Last 3 Encounters:  09/22/19 118/80  02/16/18 140/90  02/12/18 128/60   Pulse Readings from Last 3 Encounters:  09/22/19 78  02/16/18 82  02/12/18 95   Patient's weight 368.4 lb up from 365 lb on 09/13/2019.   Per Dr Darrick Huntsman:  Patient could go home and she will message the patient on Mychart.   Patient verbalized understanding of instructions.   Allean Found, CMA

## 2019-09-23 LAB — CBC WITH DIFFERENTIAL/PLATELET
Basophils Absolute: 0.1 10*3/uL (ref 0.0–0.1)
Basophils Relative: 0.8 % (ref 0.0–3.0)
Eosinophils Absolute: 0.1 10*3/uL (ref 0.0–0.7)
Eosinophils Relative: 2 % (ref 0.0–5.0)
HCT: 46.4 % (ref 39.0–52.0)
Hemoglobin: 15.3 g/dL (ref 13.0–17.0)
Lymphocytes Relative: 30.4 % (ref 12.0–46.0)
Lymphs Abs: 2.3 10*3/uL (ref 0.7–4.0)
MCHC: 32.9 g/dL (ref 30.0–36.0)
MCV: 87.5 fl (ref 78.0–100.0)
Monocytes Absolute: 0.5 10*3/uL (ref 0.1–1.0)
Monocytes Relative: 7.2 % (ref 3.0–12.0)
Neutro Abs: 4.5 10*3/uL (ref 1.4–7.7)
Neutrophils Relative %: 59.6 % (ref 43.0–77.0)
Platelets: 262 10*3/uL (ref 150.0–400.0)
RBC: 5.3 Mil/uL (ref 4.22–5.81)
RDW: 13.2 % (ref 11.5–15.5)
WBC: 7.5 10*3/uL (ref 4.0–10.5)

## 2019-09-23 LAB — MICROALBUMIN / CREATININE URINE RATIO
Creatinine,U: 118.8 mg/dL
Microalb Creat Ratio: 1.7 mg/g (ref 0.0–30.0)
Microalb, Ur: 2 mg/dL — ABNORMAL HIGH (ref 0.0–1.9)

## 2019-09-23 LAB — COMPREHENSIVE METABOLIC PANEL
ALT: 35 U/L (ref 0–53)
AST: 21 U/L (ref 0–37)
Albumin: 4.5 g/dL (ref 3.5–5.2)
Alkaline Phosphatase: 58 U/L (ref 39–117)
BUN: 15 mg/dL (ref 6–23)
CO2: 25 mEq/L (ref 19–32)
Calcium: 9.2 mg/dL (ref 8.4–10.5)
Chloride: 103 mEq/L (ref 96–112)
Creatinine, Ser: 0.87 mg/dL (ref 0.40–1.50)
GFR: 93.01 mL/min (ref >60.00)
Glucose, Bld: 95 mg/dL (ref 70–99)
Potassium: 4.3 mEq/L (ref 3.5–5.1)
Sodium: 136 mEq/L (ref 135–145)
Total Bilirubin: 0.4 mg/dL (ref 0.2–1.2)
Total Protein: 7.4 g/dL (ref 6.0–8.3)

## 2019-09-23 LAB — TSH: TSH: 1.54 u[IU]/mL (ref 0.35–4.50)

## 2019-09-23 LAB — LIPID PANEL
Cholesterol: 173 mg/dL (ref 0–200)
HDL: 43.9 mg/dL (ref >39.00)
LDL Cholesterol: 104 mg/dL — ABNORMAL HIGH (ref 0–99)
NonHDL: 128.73
Total CHOL/HDL Ratio: 4
Triglycerides: 125 mg/dL (ref 0.0–149.0)
VLDL: 25 mg/dL (ref 0.0–40.0)

## 2019-09-23 LAB — HEMOGLOBIN A1C: Hgb A1c MFr Bld: 6.1 % (ref 4.6–6.5)

## 2019-09-28 ENCOUNTER — Encounter (INDEPENDENT_AMBULATORY_CARE_PROVIDER_SITE_OTHER): Payer: Self-pay | Admitting: Vascular Surgery

## 2019-09-28 ENCOUNTER — Telehealth: Payer: Self-pay | Admitting: Internal Medicine

## 2019-09-28 ENCOUNTER — Other Ambulatory Visit: Payer: Self-pay

## 2019-09-28 ENCOUNTER — Ambulatory Visit (INDEPENDENT_AMBULATORY_CARE_PROVIDER_SITE_OTHER): Payer: BC Managed Care – PPO | Admitting: Vascular Surgery

## 2019-09-28 DIAGNOSIS — R609 Edema, unspecified: Secondary | ICD-10-CM | POA: Diagnosis not present

## 2019-09-28 DIAGNOSIS — I890 Lymphedema, not elsewhere classified: Secondary | ICD-10-CM | POA: Diagnosis not present

## 2019-09-28 NOTE — Progress Notes (Signed)
Patient ID: Stanley Fernandez, male   DOB: 09-Jun-1971, 49 y.o.   MRN: [id number]  Chief Complaint  Patient presents with  . New Patient (Initial Visit)    BLE Edema    HPI Stanley Fernandez is a 49 y.o. male.  I am asked to see the patient by Dr. Darrick Huntsman for evaluation of leg swelling.  The patient had some sort of trauma to his right leg getting stuck in a dock which resulted in severe swelling.  He was having fluid drawn off of the leg in [place] with multiple aspirations last year.  The initial trauma was about 2-1/2 years ago.  The right leg is noticeably more swollen than the left leg even still.  He had some significant left leg swelling several years ago with an ACL/MCL injury.  This took years to resolve but eventually did.  He has tried to stay active.  He does wear compression stockings.  He has been elevating his legs.  Despite these things, his leg swelling on the right leg is not improved.  He was apparently checked for DVT and did not have one with his injury.     Past Medical History:  Diagnosis Date  . allergic rhinitis    managed with antihistamine  . Bright's disease   . Chicken pox   . Hypertension   . Obesity (BMI 30-39.9)   . Sleep apnea, obstructive    last study 2009  . Traumatic hematoma of right lower leg 04/08/2017   Repeat aspiration October 03, 1998 1970 cc Repeat aspiration Feb 13, 1999 1970 cc    Past Surgical History:  Procedure Laterality Date  . FINGER NAIL SURGERY    . MEDIAL COLLATERAL LIGAMENT AND LATERAL COLLATERAL LIGAMENT REPAIR, KNEE  2007  . TONSILLECTOMY AND ADENOIDECTOMY  2004     Family History  Problem Relation Age of Onset  . Cancer Mother 10       breast, mets to brain   . Heart disease Father 54       AMI  . Heart attack Father   No bleeding or clotting disorders   Social History   Tobacco Use  . Smoking status: Never Smoker  . Smokeless tobacco: Former Engineer, water Use Topics  . Alcohol use: Yes    Comment:  rare  . Drug use: No     No Known Allergies  Current Outpatient Medications  Medication Sig Dispense Refill  . acetaminophen (TYLENOL) 500 MG tablet Take 1,000 mg by mouth every 6 (six) hours as needed.    . cholecalciferol (VITAMIN D3) 25 MCG (1000 UT) tablet Take 1,000 Units by mouth daily.    . Omega-3 Fatty Acids (FISH OIL) 600 MG CAPS Take 1 capsule by mouth 2 (two) times daily.    Marland Kitchen PRESCRIPTION MEDICATION Apply 1 application topically daily as needed. Shin    . vitamin B-12 (CYANOCOBALAMIN) 1000 MCG tablet Take 1,000 mcg by mouth daily.    . cetirizine (ZYRTEC) 10 MG tablet Take 10 mg by mouth daily.     No current facility-administered medications for this visit.      REVIEW OF SYSTEMS (Negative unless checked)  Constitutional: [] Weight loss  [] Fever  [] Chills Cardiac: [] Chest Fernandez   [] Chest pressure   [] Palpitations   [] Shortness of breath when laying flat   [] Shortness of breath at rest   [] Shortness of breath with exertion. Vascular:  [x] Fernandez in legs with walking   [] Fernandez in legs at  rest   [] Fernandez in legs when laying flat   [] Claudication   [] Fernandez in feet when walking  [] Fernandez in feet at rest  [] Fernandez in feet when laying flat   [] History of DVT   [] Phlebitis   [x] Swelling in legs   [] Varicose veins   [] Non-healing ulcers Pulmonary:   [] Uses home oxygen   [] Productive cough   [] Hemoptysis   [] Wheeze  [] COPD   [] Asthma Neurologic:  [] Dizziness  [] Blackouts   [] Seizures   [] History of stroke   [] History of TIA  [] Aphasia   [] Temporary blindness   [] Dysphagia   [] Weakness or numbness in arms   [] Weakness or numbness in legs Musculoskeletal:  [] Arthritis   [] Joint swelling   [x] Joint Fernandez   [] Low back Fernandez Hematologic:  [] Easy bruising  [] Easy bleeding   [] Hypercoagulable state   [] Anemic  [] Hepatitis Gastrointestinal:  [] Blood in stool   [] Vomiting blood  [] Gastroesophageal reflux/heartburn   [] Abdominal Fernandez Genitourinary:  [] Chronic kidney disease   [] Difficult urination   [] Frequent urination  [] Burning with urination   [] Hematuria Skin:  [] Rashes   [] Ulcers   [] Wounds Psychological:  [] History of anxiety   []  History of major depression.    Physical Exam BP (!) 158/83 (BP Location: Right Arm)   Pulse 74   Resp 14   Ht 5\' 9"  (1.753 m)   Wt (!) 377 lb (171 kg)   BMI 55.67 kg/m  Gen:  WD/WN, NAD Head: [place]/AT, No temporalis wasting.  Ear/Nose/Throat: Hearing grossly intact, nares w/o erythema or drainage, oropharynx w/o Erythema/Exudate Eyes: Conjunctiva clear, sclera non-icteric  Neck: trachea midline.  No JVD.  Pulmonary:  Good air movement, respirations not labored, no use of accessory muscles  Cardiac: RRR, no JVD Vascular:  Vessel Right Left  Radial Palpable Palpable                          PT  trace  1+  DP  1+  2+   Gastrointestinal:. No masses, surgical incisions, or scars. Musculoskeletal: M/S 5/5 throughout.  Extremities without ischemic changes.  No deformity or atrophy.  1-2+ right lower extremity edema, trace left lower extremity edema. Neurologic: Sensation grossly intact in extremities.  Symmetrical.  Speech is fluent. Motor exam as listed above. Psychiatric: Judgment intact, Mood & affect appropriate for pt's clinical situation. Dermatologic: No rashes or ulcers noted.  No cellulitis or open wounds.    Radiology No results found.  Labs Recent Results (from the past 2160 hour(s))  Lipid panel     Status: Abnormal   Collection Time: 09/22/19  3:23 PM  Result Value Ref Range   Cholesterol 173 0 - 200 mg/dL    Comment: ATP Fernandez Classification       Desirable:  < 200 mg/dL               Borderline High:  200 - 239 mg/dL          High:  > = mg/dL   Triglycerides 0.0 - 149.0 mg/dL    Comment: Normal:  mg/dLBorderline High:  150 - 199 mg/dL   HDL  mg/dL   VLDL 0.0 - mg/dL   LDL Cholesterol (H) 0 - 99 mg/dL   Total CHOL/HDL Ratio 4     Comment:                Men          Women1/2  Average Risk     3.4          3.3Average Risk          5.0          4.42X Average Risk          9.6          7.13X Average Risk          15.0          11.0                       NonHDL 128.73     Comment: NOTE:  Non-HDL goal should be 30 mg/dL higher than patient's LDL goal (i.e. LDL goal of < 70 mg/dL, would have non-HDL goal of < 100 mg/dL)  CBC with Differential/Platelet     Status: None   Collection Time: 09/22/19  3:23 PM  Result Value Ref Range   WBC 7.5 4.0 - 10.5 K/uL   RBC 5.30 4.22 - 5.81 Mil/uL   Hemoglobin 15.3 13.0 - 17.0 g/dL   HCT 38.7 56.4 - 33.2 %   MCV 87.5 78.0 - 100.0 fl   MCHC 32.9 30.0 - 36.0 g/dL   RDW 95.1 88.4 - 16.6 %   Platelets 262.0 150.0 - 400.0 K/uL   Neutrophils Relative % 59.6 43.0 - 77.0 %   Lymphocytes Relative 30.4 12.0 - 46.0 %   Monocytes Relative 7.2 3.0 - 12.0 %   Eosinophils Relative 2.0 0.0 - 5.0 %   Basophils Relative 0.8 0.0 - 3.0 %   Neutro Abs 4.5 1.4 - 7.7 K/uL   Lymphs Abs 2.3 0.7 - 4.0 K/uL   Monocytes Absolute 0.5 0.1 - 1.0 K/uL   Eosinophils Absolute 0.1 0.0 - 0.7 K/uL   Basophils Absolute 0.1 0.0 - 0.1 K/uL  TSH     Status: None   Collection Time: 09/22/19  3:23 PM  Result Value Ref Range   TSH 1.54 0.35 - 4.50 uIU/mL  Hemoglobin A1c     Status: None   Collection Time: 09/22/19  3:23 PM  Result Value Ref Range   Hgb A1c MFr Bld 6.1 4.6 - 6.5 %    Comment: Glycemic Control Guidelines for People with Diabetes:Non Diabetic:  <6%Goal of Therapy: <7%Additional Action Suggested:  >8%   Microalbumin / creatinine urine ratio     Status: Abnormal   Collection Time: 09/22/19  3:23 PM  Result Value Ref Range   Microalb, Ur 2.0 (H) 0.0 - 1.9 mg/dL   Creatinine,U 063.0 mg/dL   Microalb Creat Ratio 1.7 0.0 - 30.0 mg/g  Comprehensive metabolic panel     Status: None   Collection Time: 09/22/19  3:23 PM  Result Value Ref Range   Sodium 136 135 - 145 mEq/L   Potassium 4.3 3.5 - 5.1 mEq/L   Chloride 103 96 - 112 mEq/L   CO2 25 19 - 32  mEq/L   Glucose, Bld 95 70 - 99 mg/dL   BUN 15 6 - 23 mg/dL   Creatinine, Ser 1.60 0.40 - 1.50 mg/dL   Total Bilirubin 0.4 0.2 - 1.2 mg/dL   Alkaline Phosphatase 58 39 - 117 U/L   AST 21 0 - 37 U/L   ALT 35 0 - 53 U/L   Total Protein 7.4 6.0 - 8.3 g/dL   Albumin 4.5 3.5 - 5.2 g/dL   GFR 10.93 >23.55 mL/min   Calcium 9.2 8.4 - 10.5 mg/dL  Assessment/Plan:  Morbid obesity This worsens his lower extremity swelling and weight loss would be of benefit.  Lymphedema Patient clearly has a component of lymphedema after his previous trauma to the right leg.  He is already using compression stockings, elevating his legs, and trying to be active.  We are going to do a venous work-up as well, but a lymphedema pump will be an excellent adjuvant therapy to help his symptoms.  He currently has stage II lymphedema with some thickening of the skin as well as edema refractory to elevation and compression.  Edema I have had a long discussion with the patient regarding swelling and why it  causes symptoms.  Patient will begin wearing graduated compression stockings class 1 (20-30 mmHg) on a daily basis a prescription was given. The patient will  beginning wearing the stockings first thing in the morning and removing them in the evening. The patient is instructed specifically not to sleep in the stockings.   In addition, behavioral modification will be initiated.  This will include frequent elevation, use of over the counter Fernandez medications and exercise such as walking.  I have reviewed systemic causes for chronic edema such as liver, kidney and cardiac etiologies.  The patient denies problems with these organ systems.    Consideration for a lymph pump will also be made based upon the effectiveness of conservative therapy.  This would help to improve the edema control and prevent sequela such as ulcers and infections   Patient should undergo duplex ultrasound of the venous system to ensure that DVT or  reflux is not present.  The patient will follow-up with me after the ultrasound.        Stanley Fernandez 09/28/2019, 2:01 PM   This note was created with Dragon medical transcription system.  Any errors from dictation are unintentional.

## 2019-09-28 NOTE — Patient Instructions (Signed)
Edema  Edema is when you have too much fluid in your body or under your skin. Edema may make your legs, feet, and ankles swell up. Swelling is also common in looser tissues, like around your eyes. This is a common condition. It gets more common as you get older. There are many possible causes of edema. Eating too much salt (sodium) and being on your feet or sitting for a long time can cause edema in your legs, feet, and ankles. Hot weather may make edema worse. Edema is usually painless. Your skin may look swollen or shiny. Follow these instructions at home:  Keep the swollen body part raised (elevated) above the level of your heart when you are sitting or lying down.  Do not sit still or stand for a long time.  Do not wear tight clothes. Do not wear garters on your upper legs.  Exercise your legs. This can help the swelling go down.  Wear elastic bandages or support stockings as told by your doctor.  Eat a low-salt (low-sodium) diet to reduce fluid as told by your doctor.  Depending on the cause of your swelling, you may need to limit how much fluid you drink (fluid restriction).  Take over-the-counter and prescription medicines only as told by your doctor. Contact a doctor if:  Treatment is not working.  You have heart, liver, or kidney disease and have symptoms of edema.  You have sudden and unexplained weight gain. Get help right away if:  You have shortness of breath or chest pain.  You cannot breathe when you lie down.  You have pain, redness, or warmth in the swollen areas.  You have heart, liver, or kidney disease and get edema all of a sudden.  You have a fever and your symptoms get worse all of a sudden. Summary  Edema is when you have too much fluid in your body or under your skin.  Edema may make your legs, feet, and ankles swell up. Swelling is also common in looser tissues, like around your eyes.  Raise (elevate) the swollen body part above the level of your  heart when you are sitting or lying down.  Follow your doctor's instructions about diet and how much fluid you can drink (fluid restriction). This information is not intended to replace advice given to you by your health care provider. Make sure you discuss any questions you have with your health care provider. Document Revised: 09/05/2017 Document Reviewed: 09/20/2016 Elsevier Patient Education  2020 [employer]  

## 2019-09-28 NOTE — Assessment & Plan Note (Signed)
Patient clearly has a component of lymphedema after his previous trauma to the right leg.  He is already using compression stockings, elevating his legs, and trying to be active.  We are going to do a venous work-up as well, but a lymphedema pump will be an excellent adjuvant therapy to help his symptoms.  He currently has stage II lymphedema with some thickening of the skin as well as edema refractory to elevation and compression.

## 2019-09-28 NOTE — Assessment & Plan Note (Signed)

## 2019-09-28 NOTE — Assessment & Plan Note (Signed)
This worsens his lower extremity swelling and weight loss would be of benefit.

## 2019-10-15 ENCOUNTER — Encounter (INDEPENDENT_AMBULATORY_CARE_PROVIDER_SITE_OTHER): Payer: Self-pay | Admitting: Nurse Practitioner

## 2019-10-15 ENCOUNTER — Ambulatory Visit (INDEPENDENT_AMBULATORY_CARE_PROVIDER_SITE_OTHER): Payer: BC Managed Care – PPO

## 2019-10-15 ENCOUNTER — Other Ambulatory Visit: Payer: Self-pay

## 2019-10-15 ENCOUNTER — Ambulatory Visit (INDEPENDENT_AMBULATORY_CARE_PROVIDER_SITE_OTHER): Payer: BC Managed Care – PPO | Admitting: Nurse Practitioner

## 2019-10-15 VITALS — BP 138/86 | HR 73 | Resp 17 | Ht 69.0 in | Wt 374.0 lb

## 2019-10-15 DIAGNOSIS — I890 Lymphedema, not elsewhere classified: Secondary | ICD-10-CM | POA: Diagnosis not present

## 2019-10-15 DIAGNOSIS — R609 Edema, unspecified: Secondary | ICD-10-CM | POA: Diagnosis not present

## 2019-10-15 DIAGNOSIS — M1711 Unilateral primary osteoarthritis, right knee: Secondary | ICD-10-CM | POA: Diagnosis not present

## 2019-10-15 DIAGNOSIS — R030 Elevated blood-pressure reading, without diagnosis of hypertension: Secondary | ICD-10-CM

## 2019-10-18 ENCOUNTER — Encounter (INDEPENDENT_AMBULATORY_CARE_PROVIDER_SITE_OTHER): Payer: Self-pay | Admitting: Nurse Practitioner

## 2019-10-18 NOTE — Progress Notes (Signed)
SUBJECTIVE:  Patient ID: Stanley Fernandez, male    DOB: 1970/12/16, 49 y.o.   MRN: [id number] Chief Complaint  Patient presents with  . Follow-up    ultrasound    HPI  Stanley Fernandez is a 49 y.o. male patient follows up today for noninvasive studies regarding to leg swelling.  The patient had trauma to his right lower extremity in 2018 that resulted in severe swelling.  He has had to have fluid drawn off of his leg several times.  However the patient does note that with diligent use of medical grade 1 compression stockings the swelling has been much more contained.  The patient also does elevate his lower extremities.  However despite these conservative therapy treatment techniques the patient still continues to have lower extremity edema.  The patient has been consistently doing this for well over a year.  The right leg is noticeably more swollen than the left leg even with therapy.  Today noninvasive studies show no evidence of DVT in the right lower extremity.  There is no evidence of superficial venous thrombosis.  There is evidence of chronic venous insufficiency in the great saphenous vein from the mid thigh to the knee.  Past Medical History:  Diagnosis Date  . allergic rhinitis    managed with antihistamine  . Bright's disease   . Chicken pox   . Hypertension   . Obesity (BMI 30-39.9)   . Sleep apnea, obstructive    last study 2009  . Traumatic hematoma of right lower leg 04/08/2017   Repeat aspiration October 03, 1998 1970 cc Repeat aspiration Feb 13, 1999 1970 cc    Past Surgical History:  Procedure Laterality Date  . FINGER NAIL SURGERY    . MEDIAL COLLATERAL LIGAMENT AND LATERAL COLLATERAL LIGAMENT REPAIR, KNEE  2007  . TONSILLECTOMY AND ADENOIDECTOMY  2004    Social History   Socioeconomic History  . Marital status: Married    Spouse name: Not on file  . Number of children: Not on file  . Years of education: Not on file  . Highest education level: Not  on file  Occupational History  . Not on file  Tobacco Use  . Smoking status: Never Smoker  . Smokeless tobacco: Former Engineer, water and Sexual Activity  . Alcohol use: Yes    Comment: rare  . Drug use: No  . Sexual activity: Not on file  Other Topics Concern  . Not on file  Social History Narrative  . Not on file   Social Determinants of Health   Financial Resource Strain:   . Difficulty of Paying Living Expenses: Not on file  Food Insecurity:   . Worried About Programme researcher, broadcasting/film/video in the Last Year: Not on file  . Ran Out of Food in the Last Year: Not on file  Transportation Needs:   . Lack of Transportation (Medical): Not on file  . Lack of Transportation (Non-Medical): Not on file  Physical Activity:   . Days of Exercise per Week: Not on file  . Minutes of Exercise per Session: Not on file  Stress:   . Feeling of Stress : Not on file  Social Connections:   . Frequency of Communication with Friends and Family: Not on file  . Frequency of Social Gatherings with Friends and Family: Not on file  . Attends Religious Services: Not on file  . Active Member of Clubs or Organizations: Not on file  . Attends Club or  Organization Meetings: Not on file  . Marital Status: Not on file  Intimate Partner Violence:   . Fear of Current or Ex-Partner: Not on file  . Emotionally Abused: Not on file  . Physically Abused: Not on file  . Sexually Abused: Not on file    Family History  Problem Relation Age of Onset  . Cancer Mother 33       breast, mets to brain   . Heart disease Father 99       AMI  . Heart attack Father     No Known Allergies   Review of Systems   Review of Systems: Negative Unless Checked Constitutional: [] Weight loss  [] Fever  [] Chills Cardiac: [] Chest pain   []  Atrial Fibrillation  [] Palpitations   [] Shortness of breath when laying flat   [] Shortness of breath with exertion. [] Shortness of breath at rest Vascular:  [x] Pain in legs with walking   [] Pain  in legs with standing [] Pain in legs when laying flat   [] Claudication    [] Pain in feet when laying flat    [] History of DVT   [] Phlebitis   [x] Swelling in legs   [] Varicose veins   [] Non-healing ulcers Pulmonary:   [] Uses home oxygen   [] Productive cough   [] Hemoptysis   [] Wheeze  [] COPD   [] Asthma Neurologic:  [] Dizziness   [] Seizures  [] Blackouts [] History of stroke   [] History of TIA  [] Aphasia   [] Temporary Blindness   [] Weakness or numbness in arm   [] Weakness or numbness in leg Musculoskeletal:   [] Joint swelling   [x] Joint pain   [] Low back pain  []  History of Knee Replacement [] Arthritis [] back Surgeries  []  Spinal Stenosis    Hematologic:  [] Easy bruising  [] Easy bleeding   [] Hypercoagulable state   [] Anemic Gastrointestinal:  [] Diarrhea   [] Vomiting  [] Gastroesophageal reflux/heartburn   [] Difficulty swallowing. [] Abdominal pain Genitourinary:  [] Chronic kidney disease   [] Difficult urination  [] Anuric   [] Blood in urine [] Frequent urination  [] Burning with urination   [] Hematuria Skin:  [] Rashes   [] Ulcers [] Wounds Psychological:  [] History of anxiety   []  History of major depression  []  Memory Difficulties      OBJECTIVE:   Physical Exam  BP 138/86 (BP Location: Right Arm)   Pulse 73   Resp 17   Ht 5\' 9"  (1.753 m)   Wt (!) 374 lb (169.6 kg)   BMI 55.23 kg/m   Gen: WD/WN, NAD Head: [place]/AT, No temporalis wasting.  Ear/Nose/Throat: Hearing grossly intact, nares w/o erythema or drainage Eyes: PER, EOMI, sclera nonicteric.  Neck: Supple, no masses.  No JVD.  Pulmonary:  Good air movement, no use of accessory muscles.  Cardiac: RRR Vascular:  3+ edema right, 2+ left Vessel Right Left  Radial Palpable Palpable   Gastrointestinal: soft, non-distended. No guarding/no peritoneal signs.  Musculoskeletal: M/S 5/5 throughout.  No deformity or atrophy.  Neurologic: Pain and light touch intact in extremities.  Symmetrical.  Speech is fluent. Motor exam as listed above. Psychiatric:  Judgment intact, Mood & affect appropriate for pt's clinical situation. Dermatologic:  Mild stasis dermatitis bilaterally no Ulcers Noted.  No changes consistent with cellulitis. Lymph : No Cervical lymphadenopathy, dermal thickening bilaterally       ASSESSMENT AND PLAN:  1. Lymphedema Recommend:  No surgery or intervention at this point in time.    I have reviewed my previous discussion with the patient regarding swelling and why it causes symptoms.  Patient will continue wearing graduated compression stockings class  1 (20-30 mmHg) on a daily basis. The patient will  beginning wearing the stockings first thing in the morning and removing them in the evening. The patient is instructed specifically not to sleep in the stockings.    In addition, behavioral modification including several periods of elevation of the lower extremities during the day will be continued.  This was reviewed with the patient during the initial visit.  The patient will also continue routine exercise, especially walking on a daily basis as was discussed during the initial visit.    Despite conservative treatments for over 1 year including graduated compression therapy class 1 and behavioral modification including exercise and elevation the patient  has not obtained adequate control of the lymphedema.  The patient still has stage 3 lymphedema and therefore, I believe that a lymph pump should be added to improve the control of the patient's lymphedema.  Additionally, a lymph pump is warranted because it will reduce the risk of cellulitis and ulceration in the future.  Patient should follow-up in six months    2. Osteoarthritis of right knee, unspecified osteoarthritis type Continue NSAID medications as already ordered, these medications have been reviewed and there are no changes at this time.  Continued activity and therapy was stressed.   3. Blood pressure elevated without history of HTN Blood pressure adequately  controlled today.   Current Outpatient Medications on File Prior to Visit  Medication Sig Dispense Refill  . acetaminophen (TYLENOL) 500 MG tablet Take 1,000 mg by mouth every 6 (six) hours as needed.    . cetirizine (ZYRTEC) 10 MG tablet Take 10 mg by mouth daily.    . cholecalciferol (VITAMIN D3) 25 MCG (1000 UT) tablet Take 1,000 Units by mouth daily.    . Omega-3 Fatty Acids (FISH OIL) 600 MG CAPS Take 1 capsule by mouth 2 (two) times daily.    Marland Kitchen PRESCRIPTION MEDICATION Apply 1 application topically daily as needed. Shin    . vitamin B-12 (CYANOCOBALAMIN) 1000 MCG tablet Take 1,000 mcg by mouth daily.     No current facility-administered medications on file prior to visit.    There are no Patient Instructions on file for this visit. No follow-ups on file.   Kris Hartmann, NP  This note was completed with Sales executive.  Any errors are purely unintentional.

## 2020-03-21 ENCOUNTER — Encounter: Payer: Self-pay | Admitting: Nurse Practitioner

## 2020-03-21 ENCOUNTER — Other Ambulatory Visit: Payer: Self-pay

## 2020-03-21 ENCOUNTER — Ambulatory Visit: Payer: BC Managed Care – PPO | Admitting: Nurse Practitioner

## 2020-03-21 VITALS — BP 130/80 | HR 91 | Temp 97.6°F | Ht 69.0 in | Wt 362.0 lb

## 2020-03-21 DIAGNOSIS — R300 Dysuria: Secondary | ICD-10-CM

## 2020-03-21 LAB — COMPREHENSIVE METABOLIC PANEL
ALT: 41 U/L (ref 0–53)
AST: 25 U/L (ref 0–37)
Albumin: 4.6 g/dL (ref 3.5–5.2)
Alkaline Phosphatase: 64 U/L (ref 39–117)
BUN: 12 mg/dL (ref 6–23)
CO2: 25 mEq/L (ref 19–32)
Calcium: 9.2 mg/dL (ref 8.4–10.5)
Chloride: 102 mEq/L (ref 96–112)
Creatinine, Ser: 0.86 mg/dL (ref 0.40–1.50)
GFR: 94.07 mL/min (ref >60.00)
Glucose, Bld: 95 mg/dL (ref 70–99)
Potassium: 4.1 mEq/L (ref 3.5–5.1)
Sodium: 137 mEq/L (ref 135–145)
Total Bilirubin: 0.9 mg/dL (ref 0.2–1.2)
Total Protein: 6.9 g/dL (ref 6.0–8.3)

## 2020-03-21 LAB — URINALYSIS, MICROSCOPIC ONLY

## 2020-03-21 LAB — CBC WITH DIFFERENTIAL/PLATELET
Basophils Absolute: 0 10*3/uL (ref 0.0–0.1)
Basophils Relative: 0.4 % (ref 0.0–3.0)
Eosinophils Absolute: 0.1 10*3/uL (ref 0.0–0.7)
Eosinophils Relative: 2 % (ref 0.0–5.0)
HCT: 44.7 % (ref 39.0–52.0)
Hemoglobin: 15 g/dL (ref 13.0–17.0)
Lymphocytes Relative: 30.5 % (ref 12.0–46.0)
Lymphs Abs: 2.1 10*3/uL (ref 0.7–4.0)
MCHC: 33.6 g/dL (ref 30.0–36.0)
MCV: 88 fl (ref 78.0–100.0)
Monocytes Absolute: 0.6 10*3/uL (ref 0.1–1.0)
Monocytes Relative: 8.9 % (ref 3.0–12.0)
Neutro Abs: 4 10*3/uL (ref 1.4–7.7)
Neutrophils Relative %: 58.2 % (ref 43.0–77.0)
Platelets: 244 10*3/uL (ref 150.0–400.0)
RBC: 5.08 Mil/uL (ref 4.22–5.81)
RDW: 13.4 % (ref 11.5–15.5)
WBC: 6.9 10*3/uL (ref 4.0–10.5)

## 2020-03-21 LAB — POCT URINALYSIS DIPSTICK
Bilirubin, UA: NEGATIVE
Glucose, UA: NEGATIVE
Leukocytes, UA: NEGATIVE
Nitrite, UA: NEGATIVE
Protein, UA: NEGATIVE
Spec Grav, UA: 1.01 (ref 1.010–1.025)
Urobilinogen, UA: NEGATIVE E.U./dL — AB
pH, UA: 6 (ref 5.0–8.0)

## 2020-03-21 LAB — PSA: PSA: 0.4 ng/mL (ref 0.10–4.00)

## 2020-03-21 NOTE — Patient Instructions (Addendum)
  Please go to the lab today.   Await the UA and culture results.  I placed a referral in to Urology for your urine stream problems.   Further recommendations pending the urine tests.  Follow-up  In 2 weeks.    Dysuria Dysuria is pain or discomfort while urinating. The pain or discomfort may be felt in the part of your body that drains urine from the bladder (urethra) or in the surrounding tissue of the genitals. The pain may also be felt in the groin area, lower abdomen, or lower back. You may have to urinate frequently or have the sudden feeling that you have to urinate (urgency). Dysuria can affect both men and women, but it is more common in women. Dysuria can be caused by many different things, including:  Urinary tract infection.  Kidney stones or bladder stones.  Certain sexually transmitted infections (STIs), such as chlamydia.  Dehydration.  Inflammation of the tissues of the vagina.  Use of certain medicines.  Use of certain soaps or scented products that cause irritation. Follow these instructions at home: General instructions  Watch your condition for any changes.  Urinate often. Avoid holding urine for long periods of time.  After a bowel movement or urination, women should cleanse from front to back, using each tissue only once.  Urinate after sexual intercourse.  Keep all follow-up visits as told by your health care provider. This is important.  If you had any tests done to find the cause of dysuria, it is up to you to get your test results. Ask your health care provider, or the department that is doing the test, when your results will be ready. Eating and drinking   Drink enough fluid to keep your urine pale yellow.  Avoid caffeine, tea, and alcohol. They can irritate the bladder and make dysuria worse. In men, alcohol may irritate the prostate. Medicines  Take over-the-counter and prescription medicines only as told by your health care  provider.  If you were prescribed an antibiotic medicine, take it as told by your health care provider. Do not stop taking the antibiotic even if you start to feel better. Contact a health care provider if:  You have a fever.  You develop pain in your back or sides.  You have nausea or vomiting.  You have blood in your urine.  You are not urinating as often as you usually do. Get help right away if:  Your pain is severe and not relieved with medicines.  You cannot eat or drink without vomiting.  You are confused.  You have a rapid heartbeat while at rest.  You have shaking or chills.  You feel extremely weak. Summary  Dysuria is pain or discomfort while urinating. Many different conditions can lead to dysuria.  If you have dysuria, you may have to urinate frequently or have the sudden feeling that you have to urinate (urgency).  Watch your condition for any changes. Keep all follow-up visits as told by your health care provider.  Make sure that you urinate often and drink enough fluid to keep your urine pale yellow. This information is not intended to replace advice given to you by your health care provider. Make sure you discuss any questions you have with your health care provider. Document Revised: 08/15/2017 Document Reviewed: 06/19/2017 Elsevier Patient Education  2020 [employer].

## 2020-03-21 NOTE — Progress Notes (Signed)
Established Patient Office Visit  Subjective:  Patient ID: Stanley Fernandez, male    DOB: [birth year]  Age: 49 y.o. MRN: [id number]  CC:  Chief Complaint  Patient presents with  . Acute Visit    UTI/dizziness    HPI Stanley Fernandez is a 49 year old male who presents for onset of urinary problems. He passed urine that had a brownish tint to it two weeks ago. This happened for 2 voids in 1 day and then he had no further problems until last week.   Last Wednesday evening, after work, he tried to urinate and nothing would come out.  He drink extra fluids, had dinner, and then he was able to void normally. Later that evening, he felt a pulsation in his urethra when he tried to void and only had a trickle come out again. The same thing happened on Thurs. He did heavy yard work and went hiking over the weekend, and  he voided normally, but did note frequency (he was drinking a lot more). He has seen no red blood, and has no pain with urination.  No fever, chills, flank pain, or history of prostate problems. He does feel pressure in the groin area a little uncomfortable when he sitting.  Last 2 nights he got up one time to void at night which is unusual. He does not feel like his bladder is full or incompletely emptying.  He has no history of inguinal hernias.  He has changed his diet in the last 2 weeks.  He is cut back on sugar, carbs,  and he has been trying to drink a lot more water.  Today, he only has eaten a handful of peanuts and coffee.  Urinalysis showed positive ketones.   Past Medical History:  Diagnosis Date  . allergic rhinitis    managed with antihistamine  . Bright's disease   . Chicken pox   . Hypertension   . Obesity (BMI 30-39.9)   . Sleep apnea, obstructive    last study 2009  . Traumatic hematoma of right lower leg 04/08/2017   Repeat aspiration October 03, 1998 1970 cc Repeat aspiration Feb 13, 1999 1970 cc    Past Surgical History:  Procedure Laterality Date    . FINGER NAIL SURGERY    . MEDIAL COLLATERAL LIGAMENT AND LATERAL COLLATERAL LIGAMENT REPAIR, KNEE  2007  . TONSILLECTOMY AND ADENOIDECTOMY  2004    Family History  Problem Relation Age of Onset  . Cancer Mother 29       breast, mets to brain   . Heart disease Father 78       AMI  . Heart attack Father     Social History   Socioeconomic History  . Marital status: Married    Spouse name: Not on file  . Number of children: Not on file  . Years of education: Not on file  . Highest education level: Not on file  Occupational History  . Not on file  Tobacco Use  . Smoking status: Never Smoker  . Smokeless tobacco: Former Network engineer and Sexual Activity  . Alcohol use: Yes    Comment: rare  . Drug use: No  . Sexual activity: Not on file  Other Topics Concern  . Not on file  Social History Narrative  . Not on file   Social Determinants of Health   Financial Resource Strain:   . Difficulty of Paying Living Expenses:   Food Insecurity:   . Worried  About Running Out of Food in the Last Year:   . [place] in the Last Year:   Transportation Needs:   . Lack of Transportation (Medical):   Marland Kitchen Lack of Transportation (Non-Medical):   Physical Activity:   . Days of Exercise per Week:   . Minutes of Exercise per Session:   Stress:   . Feeling of Stress :   Social Connections:   . Frequency of Communication with Friends and Family:   . Frequency of Social Gatherings with Friends and Family:   . Attends Religious Services:   . Active Member of Clubs or Organizations:   . Attends Archivist Meetings:   Marland Kitchen Marital Status:   Intimate Partner Violence:   . Fear of Current or Ex-Partner:   . Emotionally Abused:   Marland Kitchen Physically Abused:   . Sexually Abused:     Outpatient Medications Prior to Visit  Medication Sig Dispense Refill  . acetaminophen (TYLENOL) 500 MG tablet Take 1,000 mg by mouth every 6 (six) hours as needed.    . cetirizine (ZYRTEC) 10 MG  tablet Take 10 mg by mouth daily.    . cholecalciferol (VITAMIN D3) 25 MCG (1000 UT) tablet Take 1,000 Units by mouth daily.    . Omega-3 Fatty Acids (FISH OIL) 600 MG CAPS Take 1 capsule by mouth 2 (two) times daily.    Marland Kitchen PRESCRIPTION MEDICATION Apply 1 application topically daily as needed. Shin    . vitamin B-12 (CYANOCOBALAMIN) 1000 MCG tablet Take 1,000 mcg by mouth daily.     No facility-administered medications prior to visit.    No Known Allergies  Review of Systems  Constitutional: Negative for chills, fever and unexpected weight change.  HENT: Negative.   Eyes: Negative.   Respiratory: Negative for cough and shortness of breath.   Cardiovascular: Negative for chest pain and palpitations.       He started wearing compression stockings 2 months ago.  Gastrointestinal: Positive for diarrhea.       He noted loose bowel movements last Wednesday and Thursday, resolved to normal.  No blood or melena stool.  Endocrine: Negative.   Genitourinary: Positive for decreased urine volume, difficulty urinating and frequency. Negative for discharge, flank pain, genital sores, hematuria, scrotal swelling, testicular pain and urgency.  Musculoskeletal: Negative.  Negative for back pain.  Skin: Negative.   Allergic/Immunologic: Negative.   Neurological: Negative.   Hematological: Negative.       Objective:    Physical Exam Vitals reviewed.  Constitutional:      Appearance: Normal appearance.  Eyes:     Pupils: Pupils are equal, round, and reactive to light.  Cardiovascular:     Rate and Rhythm: Normal rate and regular rhythm.     Pulses: Normal pulses.     Heart sounds: Normal heart sounds.  Pulmonary:     Effort: Pulmonary effort is normal.     Breath sounds: Normal breath sounds.  Abdominal:     Palpations: Abdomen is soft.     Tenderness: There is no abdominal tenderness. There is no right CVA tenderness or left CVA tenderness.     Comments: Small diastasis recti     Musculoskeletal:        General: Normal range of motion.     Cervical back: Normal range of motion and neck supple.  Skin:    General: Skin is warm and dry.  Neurological:     General: No focal deficit present.  Mental Status: He is alert and oriented to person, place, and time.  Psychiatric:        Mood and Affect: Mood normal.        Behavior: Behavior normal.     BP 130/80 (BP Location: Left Arm, Patient Position: Sitting, Cuff Size: Large)   Pulse 91   Temp 97.6 F (36.4 C) (Oral)   Ht _0  (1.753 m)   Wt (!) 362 lb (164.2 kg)   SpO2 98%   BMI 53.46 kg/m  Wt Readings from Last 3 Encounters:  03/21/20 (!) 362 lb (164.2 kg)  10/15/19 (!) 374 lb (169.6 kg)  09/28/19 (!) 377 lb (171 kg)     Health Maintenance Due  Topic Date Due  . COVID-19 Vaccine (1) Never done  . COLONOSCOPY  Never done    There are no preventive care reminders to display for this patient.  Lab Results  Component Value Date   TSH 1.54 09/22/2019   Lab Results  Component Value Date   WBC 6.9 03/21/2020   HGB 15.0 03/21/2020   HCT 44.7 03/21/2020   MCV 88.0 03/21/2020   PLT 244.0 03/21/2020   Lab Results  Component Value Date   NA 137 03/21/2020   K 4.1 03/21/2020   CO2 25 03/21/2020   GLUCOSE 95 03/21/2020   BUN 12 03/21/2020   CREATININE 0.86 03/21/2020   BILITOT 0.9 03/21/2020   ALKPHOS 64 03/21/2020   AST 25 03/21/2020   ALT 41 03/21/2020   PROT 6.9 03/21/2020   ALBUMIN 4.6 03/21/2020   CALCIUM 9.2 03/21/2020   ANIONGAP 10 02/03/2018   GFR 94.07 03/21/2020   Lab Results  Component Value Date   CHOL 173 09/22/2019   Lab Results  Component Value Date   HDL 43.90 09/22/2019   Lab Results  Component Value Date   LDLCALC 104 (H) 09/22/2019   Lab Results  Component Value Date   TRIG 125.0 09/22/2019   Lab Results  Component Value Date   CHOLHDL 4 09/22/2019   Lab Results  Component Value Date   HGBA1C 6.1 09/22/2019      Assessment & Plan:   Problem  List Items Addressed This Visit    None    Visit Diagnoses    Dysuria    -  Primary   Relevant Orders   Urine Culture (Completed)   Urine Microscopic Only (Completed)   POCT Urinalysis Dipstick (Completed)   Ambulatory referral to Urology   CBC with Differential/Platelet (Completed)   Comp Met (CMET) (Completed)   PSA (Completed)     He is having problems with dysuria, rule out urinary tract infection.  Consider  prostate contributing to his problems with his stream.  No history of BPH, kidney stones, weight urine studies.  Referral to urology placed.    No orders of the defined types were placed in this encounter.   Follow-up: Return in about 2 weeks (around 04/04/2020).   This visit occurred during the SARS-CoV-2 public health emergency.  Safety protocols were in place, including screening questions prior to the visit, additional usage of staff PPE, and extensive cleaning of exam room while observing appropriate contact time as indicated for disinfecting solutions.   Denice Paradise, NP

## 2020-03-22 LAB — URINE CULTURE
MICRO NUMBER:: 10670305
Result:: NO GROWTH
SPECIMEN QUALITY:: ADEQUATE

## 2020-03-23 ENCOUNTER — Telehealth: Payer: Self-pay

## 2020-03-23 NOTE — Telephone Encounter (Signed)
Spoke with patient about lab results.

## 2020-03-23 NOTE — Telephone Encounter (Signed)
LMTCB for lab results.  

## 2020-03-24 ENCOUNTER — Encounter: Payer: Self-pay | Admitting: Urology

## 2020-03-24 ENCOUNTER — Ambulatory Visit (INDEPENDENT_AMBULATORY_CARE_PROVIDER_SITE_OTHER): Payer: BC Managed Care – PPO | Admitting: Urology

## 2020-03-24 ENCOUNTER — Other Ambulatory Visit: Payer: Self-pay

## 2020-03-24 VITALS — BP 153/93 | HR 71 | Ht 69.0 in | Wt 357.8 lb

## 2020-03-24 DIAGNOSIS — R3129 Other microscopic hematuria: Secondary | ICD-10-CM

## 2020-03-24 DIAGNOSIS — R350 Frequency of micturition: Secondary | ICD-10-CM | POA: Diagnosis not present

## 2020-03-24 DIAGNOSIS — R3912 Poor urinary stream: Secondary | ICD-10-CM

## 2020-03-24 LAB — URINALYSIS, COMPLETE
Bilirubin, UA: NEGATIVE
Glucose, UA: NEGATIVE
Ketones, UA: NEGATIVE
Leukocytes,UA: NEGATIVE
Nitrite, UA: NEGATIVE
Specific Gravity, UA: 1.02 (ref 1.005–1.030)
Urobilinogen, Ur: 0.2 mg/dL (ref 0.2–1.0)
pH, UA: 5.5 (ref 5.0–7.5)

## 2020-03-24 LAB — MICROSCOPIC EXAMINATION: Bacteria, UA: NONE SEEN

## 2020-03-24 LAB — BLADDER SCAN AMB NON-IMAGING

## 2020-03-24 MED ORDER — TAMSULOSIN HCL 0.4 MG PO CAPS: 0.4000 mg | ORAL_CAPSULE | Freq: Every day | ORAL | 0 refills | Status: DC

## 2020-03-24 NOTE — Patient Instructions (Signed)
Hematuria, Adult Hematuria is blood in the urine. Blood may be visible in the urine, or it may be identified with a test. This condition can be caused by infections of the bladder, urethra, kidney, or prostate. Other possible causes include:  Kidney stones.  Cancer of the urinary tract.  Too much calcium in the urine.  Conditions that are passed from parent to child (inherited conditions).  Exercise that requires a lot of energy. Infections can usually be treated with medicine, and a kidney stone usually will pass through your urine. If neither of these is the cause of your hematuria, more tests may be needed to identify the cause of your symptoms. It is very important to tell your health care provider about any blood in your urine, even if it is painless or the blood stops without treatment. Blood in the urine, when it happens and then stops and then happens again, can be a symptom of a very serious condition, including cancer. There is no pain in the initial stages of many urinary cancers. Follow these instructions at home: Medicines  Take over-the-counter and prescription medicines only as told by your health care provider.  If you were prescribed an antibiotic medicine, take it as told by your health care provider. Do not stop taking the antibiotic even if you start to feel better. Eating and drinking  Drink enough fluid to keep your urine clear or pale yellow. It is recommended that you drink 3-4 quarts (2.8-3.8 L) a day. If you have been diagnosed with an infection, it is recommended that you drink cranberry juice in addition to large amounts of water.  Avoid caffeine, tea, and carbonated beverages. These tend to irritate the bladder.  Avoid alcohol because it may irritate the prostate (men). General instructions  If you have been diagnosed with a kidney stone, follow your health care provider's instructions about straining your urine to catch the stone.  Empty your bladder  often. Avoid holding urine for long periods of time.  If you are male: ? After a bowel movement, wipe from front to back and use each piece of toilet paper only once. ? Empty your bladder before and after sex.  Pay attention to any changes in your symptoms. Tell your health care provider about any changes or any new symptoms.  It is your responsibility to get your test results. Ask your health care provider, or the department performing the test, when your results will be ready.  Keep all follow-up visits as told by your health care provider. This is important. Contact a health care provider if:  You develop back pain.  You have a fever.  You have nausea or vomiting.  Your symptoms do not improve after 3 days.  Your symptoms get worse. Get help right away if:  You develop severe vomiting and are unable take medicine without vomiting.  You develop severe pain in your back or abdomen even though you are taking medicine.  You pass a large amount of blood in your urine.  You pass blood clots in your urine.  You feel very weak or like you might faint.  You faint. Summary  Hematuria is blood in the urine. It has many possible causes.  It is very important that you tell your health care provider about any blood in your urine, even if it is painless or the blood stops without treatment.  Take over-the-counter and prescription medicines only as told by your health care provider.  Drink enough fluid to keep   your urine clear or pale yellow. This information is not intended to replace advice given to you by your health care provider. Make sure you discuss any questions you have with your health care provider. Document Revised: 01/27/2019 Document Reviewed: 10/05/2016 Elsevier Patient Education  2020 [employer] Cystoscopy Cystoscopy is a procedure that is used to help diagnose and sometimes treat conditions that affect the lower urinary tract. The lower urinary tract includes  the bladder and the urethra. The urethra is the tube that drains urine from the bladder. Cystoscopy is done using a thin, tube-shaped instrument with a light and camera at the end (cystoscope). The cystoscope may be hard or flexible, depending on the goal of the procedure. The cystoscope is inserted through the urethra, into the bladder. Cystoscopy may be recommended if you have:  Urinary tract infections that keep coming back.  Blood in the urine (hematuria).  An inability to control when you urinate (urinary incontinence) or an overactive bladder.  Unusual cells found in a urine sample.  A blockage in the urethra, such as a urinary stone.  Painful urination.  An abnormality in the bladder found during an intravenous pyelogram (IVP) or CT scan. Cystoscopy may also be done to remove a sample of tissue to be examined under a microscope (biopsy). Tell a health care provider about:  Any allergies you have.  All medicines you are taking, including vitamins, herbs, eye drops, creams, and over-the-counter medicines.  Any problems you or family members have had with anesthetic medicines.  Any blood disorders you have.  Any surgeries you have had.  Any medical conditions you have.  Whether you are pregnant or may be pregnant. What are the risks? Generally, this is a safe procedure. However, problems may occur, including:  Infection.  Bleeding.  Allergic reactions to medicines.  Damage to other structures or organs. What happens before the procedure?  Ask your health care provider about: ? Changing or stopping your regular medicines. This is especially important if you are taking diabetes medicines or blood thinners. ? Taking medicines such as aspirin and ibuprofen. These medicines can thin your blood. Do not take these medicines unless your health care provider tells you to take them. ? Taking over-the-counter medicines, vitamins, herbs, and supplements.  Follow instructions  from your health care provider about eating or drinking restrictions.  Ask your health care provider what steps will be taken to help prevent infection. These may include: ? Washing skin with a germ-killing soap. ? Taking antibiotic medicine.  You may have an exam or testing, such as: ? X-rays of the bladder, urethra, or kidneys. ? Urine tests to check for signs of infection.  Plan to have someone take you home from the hospital or clinic. What happens during the procedure?   You will be given one or more of the following: ? A medicine to help you relax (sedative). ? A medicine to numb the area (local anesthetic).  The area around the opening of your urethra will be cleaned.  The cystoscope will be passed through your urethra into your bladder.  Germ-free (sterile) fluid will flow through the cystoscope to fill your bladder. The fluid will stretch your bladder so that your health care provider can clearly examine your bladder walls.  Your doctor will look at the urethra and bladder. Your doctor may take a biopsy or remove stones.  The cystoscope will be removed, and your bladder will be emptied. The procedure may vary among health care providers and hospitals.   What can I expect after the procedure? After the procedure, it is common to have:  Some soreness or pain in your abdomen and urethra.  Urinary symptoms. These include: ? Mild pain or burning when you urinate. Pain should stop within a few minutes after you urinate. This may last for up to 1 week. ? A small amount of blood in your urine for several days. ? Feeling like you need to urinate but producing only a small amount of urine. Follow these instructions at home: Medicines  Take over-the-counter and prescription medicines only as told by your health care provider.  If you were prescribed an antibiotic medicine, take it as told by your health care provider. Do not stop taking the antibiotic even if you start to feel  better. General instructions  Return to your normal activities as told by your health care provider. Ask your health care provider what activities are safe for you.  Do not drive for 24 hours if you were given a sedative during your procedure.  Watch for any blood in your urine. If the amount of blood in your urine increases, call your health care provider.  Follow instructions from your health care provider about eating or drinking restrictions.  If a tissue sample was removed for testing (biopsy) during your procedure, it is up to you to get your test results. Ask your health care provider, or the department that is doing the test, when your results will be ready.  Drink enough fluid to keep your urine pale yellow.  Keep all follow-up visits as told by your health care provider. This is important. Contact a health care provider if you:  Have pain that gets worse or does not get better with medicine, especially pain when you urinate.  Have trouble urinating.  Have more blood in your urine. Get help right away if you:  Have blood clots in your urine.  Have abdominal pain.  Have a fever or chills.  Are unable to urinate. Summary  Cystoscopy is a procedure that is used to help diagnose and sometimes treat conditions that affect the lower urinary tract.  Cystoscopy is done using a thin, tube-shaped instrument with a light and camera at the end.  After the procedure, it is common to have some soreness or pain in your abdomen and urethra.  Watch for any blood in your urine. If the amount of blood in your urine increases, call your health care provider.  If you were prescribed an antibiotic medicine, take it as told by your health care provider. Do not stop taking the antibiotic even if you start to feel better. This information is not intended to replace advice given to you by your health care provider. Make sure you discuss any questions you have with your health care  provider. Document Revised: 08/25/2018 Document Reviewed: 08/25/2018 Elsevier Patient Education  2020 [employer] Inc[employer]  

## 2020-03-24 NOTE — Progress Notes (Signed)
03/24/2020 10:18 AM   Stanley Fernandez 05-16-1971 [id number]  Referring provider: Theadore Nan, NP [address] [place],  [place] [address]  Chief Complaint  Patient presents with  . Dysuria    HPI:  Stanley Fernandez was referred over for lower urinary tract symptoms.  He noted in 06/21 decreased urine output, brown urine, hesitancy and a slow stream.  He then increased his fluid intake and noted some frequency.  He had a good lab evaluation with a normal urinalysis, negative culture, white count of 6.9, creatinine 0.86 and PSA of 0.4. He had some diarrhea and constipation rotation. He has MH on UA with 3-10 rbc. AUASS = 20. He has a h/o bright's disease. He had some left flank pain recently. No stone passage. Non-smoker. No FH PCa.   He is a Fut[employer] trader. He changes his diet.   PMH: Past Medical History:  Diagnosis Date  . allergic rhinitis    managed with antihistamine  . Bright's disease   . Chicken pox   . Hypertension   . Obesity (BMI 30-39.9)   . Sleep apnea, obstructive    last study 2009  . Traumatic hematoma of right lower leg 04/08/2017   Repeat aspiration October 03, 1998 1970 cc Repeat aspiration Feb 13, 1999 1970 cc    Surgical History: Past Surgical History:  Procedure Laterality Date  . FINGER NAIL SURGERY    . MEDIAL COLLATERAL LIGAMENT AND LATERAL COLLATERAL LIGAMENT REPAIR, KNEE  2007  . TONSILLECTOMY AND ADENOIDECTOMY  2004    Home Medications:  Allergies as of 03/24/2020   No Known Allergies     Medication List       Accurate as of March 24, 2020 10:18 AM. If you have any questions, ask your nurse or doctor.        acetaminophen 500 MG tablet Commonly known as: TYLENOL Take 1,000 mg by mouth every 6 (six) hours as needed.   cetirizine 10 MG tablet Commonly known as: ZYRTEC Take 10 mg by mouth daily.   cholecalciferol 25 MCG (1000 UNIT) tablet Commonly known as: VITAMIN D3 Take 1,000 Units by mouth daily.   Fish Oil  600 MG Caps Take 1 capsule by mouth 2 (two) times daily.   PRESCRIPTION MEDICATION Apply 1 application topically daily as needed. Shin   vitamin B-12 1000 MCG tablet Commonly known as: CYANOCOBALAMIN Take 1,000 mcg by mouth daily.       Allergies: No Known Allergies  Family History: Family History  Problem Relation Age of Onset  . Cancer Mother 73       breast, mets to brain   . Heart disease Father 7       AMI  . Heart attack Father     Social History:  reports that he has never smoked. He quit smokeless tobacco use about 17 years ago. He reports current alcohol use. He reports that he does not use drugs.   Physical Exam: There were no vitals taken for this visit.  Constitutional:  Alert and oriented, No acute distress. HEENT: [place] AT, moist mucus membranes.  Trachea midline, no masses. Cardiovascular: No clubbing, cyanosis, or edema. Respiratory: Normal respiratory effort, no increased work of breathing. GI: Abdomen is soft, nontender, nondistended, no abdominal masses GU: No CVA tenderness Lymph: No cervical or inguinal lymphadenopathy. Skin: No rashes, bruises or suspicious lesions. Neurologic: Grossly intact, no focal deficits, moving all 4 extremities. Psychiatric: Normal mood and affect. GU: Penis circumcised, normal foreskin, testicles  descended bilaterally and palpably normal, bilateral epididymis palpably normal, scrotum normal DRE: Prostate 20 g, but difficult exam due to body habitus, smooth without hard area or nodule  Laboratory Data: Lab Results  Component Value Date   WBC 6.9 03/21/2020   HGB 15.0 03/21/2020   HCT 44.7 03/21/2020   MCV 88.0 03/21/2020   PLT 244.0 03/21/2020    Lab Results  Component Value Date   CREATININE 0.86 03/21/2020    Lab Results  Component Value Date   PSA 0.40 03/21/2020    No results found for: TESTOSTERONE  Lab Results  Component Value Date   HGBA1C 6.1 09/22/2019    Urinalysis    Component Value  Date/Time   COLORURINE YELLOW 06/19/2015 0907   APPEARANCEUR CLEAR 06/19/2015 0907   LABSPEC 1.025 06/19/2015 0907   PHURINE 6.0 06/19/2015 0907   GLUCOSEU NEGATIVE 06/19/2015 0907   HGBUR NEGATIVE 06/19/2015 0907   BILIRUBINUR neg 03/21/2020 1314   KETONESUR NEGATIVE 06/19/2015 0907   PROTEINUR Negative 03/21/2020 1314   UROBILINOGEN negative (A) 03/21/2020 1314   UROBILINOGEN 0.2 06/19/2015 0907   NITRITE neg 03/21/2020 1314   NITRITE NEGATIVE 06/19/2015 0907   LEUKOCYTESUR Negative 03/21/2020 1314    No results found for: LABMICR, WBCUA, RBCUA, LABEPIT, MUCUS, BACTERIA  Pertinent Imaging: N/A No results found for this or any previous visit.  No results found for this or any previous visit.  No results found for this or any previous visit.  No results found for this or any previous visit.  No results found for this or any previous visit.  No results found for this or any previous visit.  No results found for this or any previous visit.  No results found for this or any previous visit.   Assessment & Plan:    Weak stream, frequency-possible related to fluid intake and bowels, but could be kidney stone. Assess as below. Start tamsulosin.   Microhematuria - given hematuria and symptoms. We discussed nature r/b/a to cysto and CT and he will proceed.   No follow-ups on file.  Jerilee Field, MD  [facility] [address], [address] [place], [place] [address] [phone]

## 2020-04-07 ENCOUNTER — Ambulatory Visit: Payer: BC Managed Care – PPO | Admitting: Internal Medicine

## 2020-04-10 ENCOUNTER — Encounter: Payer: Self-pay | Admitting: Internal Medicine

## 2020-04-10 ENCOUNTER — Ambulatory Visit: Payer: BC Managed Care – PPO | Admitting: Internal Medicine

## 2020-04-10 ENCOUNTER — Other Ambulatory Visit: Payer: Self-pay

## 2020-04-10 DIAGNOSIS — R7303 Prediabetes: Secondary | ICD-10-CM | POA: Diagnosis not present

## 2020-04-10 DIAGNOSIS — Z87448 Personal history of other diseases of urinary system: Secondary | ICD-10-CM | POA: Diagnosis not present

## 2020-04-10 DIAGNOSIS — Z87898 Personal history of other specified conditions: Secondary | ICD-10-CM

## 2020-04-10 NOTE — Assessment & Plan Note (Addendum)
Lab Results  Component Value Date   HGBA1C 6.1 09/22/2019   He has lost weight intentionally with a carbohydrate restricted diet. Will recheck a1c annually  

## 2020-04-10 NOTE — Progress Notes (Signed)
Subjective:  Patient ID: Stanley Fernandez, male    DOB: November 11, 1970  Age: 49 y.o. MRN: [id number]  CC: Diagnoses of Prediabetes and History of gross hematuria were pertinent to this visit.  HPI Stanley Fernandez presents for follow up  On urinary retention and hematuria  in early July acc'd by dehydration .   This visit occurred during the SARS-CoV-2 public health emergency.  Safety protocols were in place, including screening questions prior to the visit, additional usage of staff PPE, and extensive cleaning of exam room while observing appropriate contact time as indicated for disinfecting solutions.        Stanley Fernandez is a 49 yr old male with a history of Bright's Disease as an adolescent who presented to NP on July 6 with a  4 day history  Of urinary hesitancy and weak stream that progressed to urinary retention lasting about 8 hours.  Symptoms were aggravated by standing up and relieved with supine position. Accompanied by vertigo.  He ruled out for UTI and prostatitis and was seen by urology with an office evaluation, told his prostate was normal sized,  And set up for  Cysto and CT .  He presents today prior to additional testing and states that all symptoms have resolvled except for an intermittently occurring  sharp pain in his RLQ that is difficult to describe or  pinpoint given his large pannus.   Symptoms improved without initiation of Flomax prescribed by Urology .  Still taking  zyrtec intermittently.    Obesity/prediabetes:  He has lost  20 lbs since January intentionally through dietary restrictions.    Lymphedema:  He notes improved leg edema since he started pumping  Twice daily 2 months ago   Outpatient Medications Prior to Visit  Medication Sig Dispense Refill  . acetaminophen (TYLENOL) 500 MG tablet Take 1,000 mg by mouth every 6 (six) hours as needed.    . cetirizine (ZYRTEC) 10 MG tablet Take 10 mg by mouth daily.    . cholecalciferol (VITAMIN D3) 25 MCG (1000 UT)  tablet Take 1,000 Units by mouth daily.    . Omega-3 Fatty Acids (FISH OIL) 600 MG CAPS Take 1 capsule by mouth 2 (two) times daily.    Marland Kitchen PRESCRIPTION MEDICATION Apply 1 application topically daily as needed. Shin    . vitamin B-12 (CYANOCOBALAMIN) 1000 MCG tablet Take 1,000 mcg by mouth daily.    . tamsulosin (FLOMAX) 0.4 MG CAPS capsule Take 1 capsule (0.4 mg total) by mouth daily after supper. (Patient not taking: Reported on 04/10/2020) 30 capsule 0   No facility-administered medications prior to visit.    Review of Systems;  Patient denies headache, fevers, malaise, unintentional weight loss, skin rash, eye pain, sinus congestion and sinus pain, sore throat, dysphagia,  hemoptysis , cough, dyspnea, wheezing, chest pain, palpitations, orthopnea, edema, abdominal pain, nausea, melena, diarrhea, constipation, flank pain, dysuria, hematuria, urinary  Frequency, nocturia, numbness, tingling, seizures,  Focal weakness, Loss of consciousness,  Tremor, insomnia, depression, anxiety, and suicidal ideation.      Objective:  BP (!) 132/76 (BP Location: Left Arm, Patient Position: Sitting, Cuff Size: Large)   Pulse 68   Temp 98.3 F (36.8 C) (Oral)   Resp 16   Ht 5\' 9"  (1.753 m)   Wt (!) 358 lb (162.4 kg)   SpO2 97%   BMI 52.87 kg/m   BP Readings from Last 3 Encounters:  04/10/20 (!) 132/76  03/24/20 (!) 153/93  03/21/20 130/80  Wt Readings from Last 3 Encounters:  04/10/20 (!) 358 lb (162.4 kg)  03/24/20 (!) 357 lb 12.8 oz (162.3 kg)  03/21/20 (!) 362 lb (164.2 kg)    General appearance: alert, cooperative and appears stated age Ears: normal TM's and external ear canals both ears Throat: lips, mucosa, and tongue normal; teeth and gums normal Neck: no adenopathy, no carotid bruit, supple, symmetrical, trachea midline and thyroid not enlarged, symmetric, no tenderness/mass/nodules Back: symmetric, no curvature. ROM normal. No CVA tenderness. Lungs: clear to auscultation  bilaterally Heart: regular rate and rhythm, S1, S2 normal, no murmur, click, rub or gallop Abdomen: soft, non-tender; bowel sounds normal; no masses,  no organomegaly Pulses: 2+ and symmetric Skin: Skin color, texture, turgor normal. No rashes or lesions Lymph nodes: Cervical, supraclavicular, and axillary nodes normal.  Lab Results  Component Value Date   HGBA1C 6.1 09/22/2019   HGBA1C 5.9 02/10/2018   HGBA1C 6.1 01/21/2013    Lab Results  Component Value Date   CREATININE 0.86 03/21/2020   CREATININE 0.87 09/22/2019   CREATININE 0.93 02/10/2018    Lab Results  Component Value Date   WBC 6.9 03/21/2020   HGB 15.0 03/21/2020   HCT 44.7 03/21/2020   PLT 244.0 03/21/2020   GLUCOSE 95 03/21/2020   CHOL 173 09/22/2019   TRIG 125.0 09/22/2019   HDL 43.90 09/22/2019   LDLDIRECT 93.0 02/10/2018   LDLCALC 104 (H) 09/22/2019   ALT 41 03/21/2020   AST 25 03/21/2020   NA 137 03/21/2020   K 4.1 03/21/2020   CL 102 03/21/2020   CREATININE 0.86 03/21/2020   BUN 12 03/21/2020   CO2 25 03/21/2020   TSH 1.54 09/22/2019   PSA 0.40 03/21/2020   HGBA1C 6.1 09/22/2019   MICROALBUR 2.0 (H) 09/22/2019    VAS [place] LE ART SEG MULTI (Segm&LE Reynauds)  Result Date: 02/19/2018 LOWER EXTREMITY DOPPLER STUDY Indications: Right lower extremity pain and 2+ pulses at office visit on              02/12/18. High Risk Factors: Hypertension, diabetes, no history of smoking.  Performing Technologist: Alesia Banda RVT  Examination Guidelines: A complete evaluation includes B-mode imaging, spectral Doppler, color Doppler, and power Doppler as needed of all accessible portions of each vessel. Bilateral testing is considered an integral part of a complete examination. Limited examinations for reoccurring indications may be performed as noted.  ABI Findings: +---------+------------------+-----+---------+--------+ Right    Rt Pressure (mmHg)IndexWaveform Comment   +---------+------------------+-----+---------+--------+ CFA                             triphasic         +---------+------------------+-----+---------+--------+ Popliteal                       triphasic         +---------+------------------+-----+---------+--------+ ATA      163               1.16 triphasic         +---------+------------------+-----+---------+--------+ PTA      174               1.23 triphasic         +---------+------------------+-----+---------+--------+ PERO     179               1.27 triphasic         +---------+------------------+-----+---------+--------+ Lynelle Smoke  0.99 Normal            +---------+------------------+-----+---------+--------+ +---------+------------------+-----+---------+-------+ Left     Lt Pressure (mmHg)IndexWaveform Comment +---------+------------------+-----+---------+-------+ Brachial 141                                     +---------+------------------+-----+---------+-------+ CFA                             triphasic        +---------+------------------+-----+---------+-------+ Popliteal                       triphasic        +---------+------------------+-----+---------+-------+ ATA      177               1.26 triphasic        +---------+------------------+-----+---------+-------+ PTA      174               1.23 triphasic        +---------+------------------+-----+---------+-------+ PERO     179               1.27 triphasic        +---------+------------------+-----+---------+-------+ Great Toe148               1.05 Normal           +---------+------------------+-----+---------+-------+ +-------+-----------+-----------+------------+------------+ ABI/TBIToday's ABIToday's TBIPrevious ABIPrevious TBI +-------+-----------+-----------+------------+------------+ Right  1.27       0.99                                 +-------+-----------+-----------+------------+------------+ Left   1.27       1.05                                +-------+-----------+-----------+------------+------------+  Final Interpretation: Right: Resting right ankle-brachial index is within normal range. No evidence of significant right lower extremity arterial disease. The right toe-brachial index is normal. Left: Resting left ankle-brachial index is within normal range. No evidence of significant left lower extremity arterial disease. The left toe-brachial index is normal.  *See table(s) above for measurements and observations.  Electronically signed by Tobias Alexander MD on 02/19/2018 at 7:06:53 PM.    Final     Assessment & Plan:   Problem List Items Addressed This Visit      Unprioritized   Prediabetes    Lab Results  Component Value Date   HGBA1C 6.1 09/22/2019   He has lost weight intentionally with a carbohydrate restricted diet. Will recheck a1c annually       History of gross hematuria    Recommend following through with cystoscopy and CT scan,   Since there was no evidence of infection, to rule out bladder CA and renal cell CA          I am having Chrissie Noa B. Earlene Plater Fernandez [employer]" maintain his cetirizine, acetaminophen, PRESCRIPTION MEDICATION, vitamin B-12, cholecalciferol, Fish Oil, and tamsulosin.  No orders of the defined types were placed in this encounter.   There are no discontinued medications.  Follow-up: Return in about 6 months (around 10/11/2020).   Sherlene Shams, MD

## 2020-04-11 ENCOUNTER — Encounter: Payer: Self-pay | Admitting: Internal Medicine

## 2020-04-11 DIAGNOSIS — Z87448 Personal history of other diseases of urinary system: Secondary | ICD-10-CM | POA: Insufficient documentation

## 2020-04-11 DIAGNOSIS — Z87898 Personal history of other specified conditions: Secondary | ICD-10-CM | POA: Insufficient documentation

## 2020-04-11 NOTE — Assessment & Plan Note (Signed)
Recommend following through with cystoscopy and CT scan,   Since there was no evidence of infection, to rule out bladder CA and renal cell CA

## 2020-04-12 ENCOUNTER — Other Ambulatory Visit: Payer: Self-pay

## 2020-04-12 ENCOUNTER — Ambulatory Visit
Admission: RE | Admit: 2020-04-12 | Discharge: 2020-04-12 | Disposition: A | Discharge status: Home / Self Care | Payer: BC Managed Care – PPO | Source: Ambulatory Visit | Attending: Urology | Admitting: Urology

## 2020-04-12 DIAGNOSIS — R3129 Other microscopic hematuria: Secondary | ICD-10-CM | POA: Insufficient documentation

## 2020-04-12 MED ORDER — IOHEXOL 350 MG/ML SOLN
125.0000 mL | Freq: Once | INTRAVENOUS | Status: AC | PRN
  Administered 2020-04-12: 125 mL via INTRAVENOUS

## 2020-04-14 ENCOUNTER — Ambulatory Visit (INDEPENDENT_AMBULATORY_CARE_PROVIDER_SITE_OTHER): Payer: BC Managed Care – PPO | Admitting: Nurse Practitioner

## 2020-04-14 ENCOUNTER — Other Ambulatory Visit: Payer: Self-pay

## 2020-04-14 ENCOUNTER — Encounter (INDEPENDENT_AMBULATORY_CARE_PROVIDER_SITE_OTHER): Payer: Self-pay | Admitting: Nurse Practitioner

## 2020-04-14 VITALS — BP 143/83 | HR 59 | Resp 14 | Ht 69.0 in | Wt 348.0 lb

## 2020-04-14 DIAGNOSIS — I890 Lymphedema, not elsewhere classified: Secondary | ICD-10-CM | POA: Diagnosis not present

## 2020-04-14 NOTE — Progress Notes (Signed)
Subjective:    Patient ID: Stanley Fernandez, male    DOB: May 20, 1971, 49 y.o.   MRN: [id number] Chief Complaint  Patient presents with  . Follow-up    6 mos no studies    The patient returns to the office for followup evaluation regarding leg swelling.  The swelling has persisted but with the lymph pump is much, much better controlled. The pain associated with swelling is essentially eliminated. There have not been any interval development of a ulcerations or wounds.  The patient denies problems with the pump, noting it is working well and the leggings are in good condition.  Since the previous visit the patient has been wearing graduated compression stockings and using the lymph pump on a routine basis and  has noted significant improvement in the lymphedema.   Patient stated the lymph pump has been a very positive factor in his care.    Review of Systems  Cardiovascular: Positive for leg swelling.  All other systems reviewed and are negative.      Objective:   Physical Exam Vitals reviewed.  HENT:     Head: Normocephalic.  Cardiovascular:     Rate and Rhythm: Normal rate.     Pulses: Normal pulses.  Pulmonary:     Effort: Pulmonary effort is normal.  Musculoskeletal:        General: Normal range of motion.     Right lower leg: Edema present.  Skin:    General: Skin is warm and dry.     Capillary Refill: Capillary refill takes less than 2 seconds.  Neurological:     Mental Status: He is alert and oriented to person, place, and time. Mental status is at baseline.  Psychiatric:        Mood and Affect: Mood normal.        Behavior: Behavior normal.        Thought Content: Thought content normal.        Judgment: Judgment normal.     BP (!) 143/83 (BP Location: Right Arm)   Pulse 59   Resp 14   Ht 5\' 9"  (1.753 m)   Wt (!) 348 lb (157.9 kg)   BMI 51.39 kg/m   Past Medical History:  Diagnosis Date  . allergic rhinitis    managed with antihistamine  .  Bright's disease   . Chicken pox   . Hypertension   . Obesity (BMI 30-39.9)   . Sleep apnea, obstructive    last study 2009  . Traumatic hematoma of right lower leg 04/08/2017   Repeat aspiration October 03, 1998 1970 cc Repeat aspiration Feb 13, 1999 1970 cc    Social History   Socioeconomic History  . Marital status: Married    Spouse name: Not on file  . Number of children: Not on file  . Years of education: Not on file  . Highest education level: Not on file  Occupational History  . Not on file  Tobacco Use  . Smoking status: Never Smoker  . Smokeless tobacco: Former February 15, 1999 and Sexual Activity  . Alcohol use: Yes    Comment: rare  . Drug use: No  . Sexual activity: Not on file  Other Topics Concern  . Not on file  Social History Narrative  . Not on file   Social Determinants of Health   Financial Resource Strain:   . Difficulty of Paying Living Expenses:   Food Insecurity:   . Worried About Engineer, water of  Food in the Last Year:   . Ran Out of Food in the Last Year:   Transportation Needs:   . Freight forwarder (Medical):   Marland Kitchen Lack of Transportation (Non-Medical):   Physical Activity:   . Days of Exercise per Week:   . Minutes of Exercise per Session:   Stress:   . Feeling of Stress :   Social Connections:   . Frequency of Communication with Friends and Family:   . Frequency of Social Gatherings with Friends and Family:   . Attends Religious Services:   . Active Member of Clubs or Organizations:   . Attends Banker Meetings:   Marland Kitchen Marital Status:   Intimate Partner Violence:   . Fear of Current or Ex-Partner:   . Emotionally Abused:   Marland Kitchen Physically Abused:   . Sexually Abused:     Past Surgical History:  Procedure Laterality Date  . FINGER NAIL SURGERY    . MEDIAL COLLATERAL LIGAMENT AND LATERAL COLLATERAL LIGAMENT REPAIR, KNEE  2007  . TONSILLECTOMY AND ADENOIDECTOMY  2004    Family History  Problem Relation Age of Onset    . Cancer Mother 30       breast, mets to brain   . Heart disease Father 70       AMI  . Heart attack Father     No Known Allergies     Assessment & Plan:   1. Lymphedema  No surgery or intervention at this point in time.    I have reviewed my discussion with the patient regarding lymphedema and why it  causes symptoms.  Patient will continue wearing graduated compression stockings class 1 (20-30 mmHg) on a daily basis a prescription was given. The patient is reminded to put the stockings on first thing in the morning and removing them in the evening. The patient is instructed specifically not to sleep in the stockings.   In addition, behavioral modification throughout the day will be continued.  This will include frequent elevation (such as in a recliner), use of over the counter pain medications as needed and exercise such as walking.  I have reviewed systemic causes for chronic edema such as liver, kidney and cardiac etiologies and there does not appear to be any significant changes in these organ systems over the past year.  The patient is under the impression that these organ systems are all stable and unchanged.    The patient will continue aggressive use of the  lymph pump.  This will continue to improve the edema control and prevent sequela such as ulcers and infections.   The patient will follow-up with me on an annual basis.      Current Outpatient Medications on File Prior to Visit  Medication Sig Dispense Refill  . acetaminophen (TYLENOL) 500 MG tablet Take 1,000 mg by mouth every 6 (six) hours as needed.    . cetirizine (ZYRTEC) 10 MG tablet Take 10 mg by mouth daily.    . cholecalciferol (VITAMIN D3) 25 MCG (1000 UT) tablet Take 1,000 Units by mouth daily.    . Omega-3 Fatty Acids (FISH OIL) 600 MG CAPS Take 1 capsule by mouth 2 (two) times daily.    Marland Kitchen PRESCRIPTION MEDICATION Apply 1 application topically daily as needed. Shin    . vitamin B-12 (CYANOCOBALAMIN) 1000  MCG tablet Take 1,000 mcg by mouth daily.    . tamsulosin (FLOMAX) 0.4 MG CAPS capsule Take 1 capsule (0.4 mg total) by mouth daily after supper. (  Patient not taking: Reported on 04/10/2020) 30 capsule 0   No current facility-administered medications on file prior to visit.    There are no Patient Instructions on file for this visit. No follow-ups on file.   Georgiana Spinner, NP

## 2020-04-19 ENCOUNTER — Encounter: Payer: Self-pay | Admitting: Urology

## 2020-04-19 ENCOUNTER — Other Ambulatory Visit: Payer: Self-pay

## 2020-04-19 ENCOUNTER — Ambulatory Visit (INDEPENDENT_AMBULATORY_CARE_PROVIDER_SITE_OTHER): Payer: BC Managed Care – PPO | Admitting: Urology

## 2020-04-19 VITALS — BP 156/87 | HR 82 | Ht 69.0 in | Wt 346.0 lb

## 2020-04-19 DIAGNOSIS — R3129 Other microscopic hematuria: Secondary | ICD-10-CM | POA: Diagnosis not present

## 2020-04-19 NOTE — Patient Instructions (Signed)
Hematuria, Adult Hematuria is blood in the urine. Blood may be visible in the urine, or it may be identified with a test. This condition can be caused by infections of the bladder, urethra, kidney, or prostate. Other possible causes include:  Kidney stones.  Cancer of the urinary tract.  Too much calcium in the urine.  Conditions that are passed from parent to child (inherited conditions).  Exercise that requires a lot of energy. Infections can usually be treated with medicine, and a kidney stone usually will pass through your urine. If neither of these is the cause of your hematuria, more tests may be needed to identify the cause of your symptoms. It is very important to tell your health care provider about any blood in your urine, even if it is painless or the blood stops without treatment. Blood in the urine, when it happens and then stops and then happens again, can be a symptom of a very serious condition, including cancer. There is no pain in the initial stages of many urinary cancers. Follow these instructions at home: Medicines  Take over-the-counter and prescription medicines only as told by your health care provider.  If you were prescribed an antibiotic medicine, take it as told by your health care provider. Do not stop taking the antibiotic even if you start to feel better. Eating and drinking  Drink enough fluid to keep your urine clear or pale yellow. It is recommended that you drink 3-4 quarts (2.8-3.8 L) a day. If you have been diagnosed with an infection, it is recommended that you drink cranberry juice in addition to large amounts of water.  Avoid caffeine, tea, and carbonated beverages. These tend to irritate the bladder.  Avoid alcohol because it may irritate the prostate (men). General instructions  If you have been diagnosed with a kidney stone, follow your health care provider's instructions about straining your urine to catch the stone.  Empty your bladder  often. Avoid holding urine for long periods of time.  If you are male: ? After a bowel movement, wipe from front to back and use each piece of toilet paper only once. ? Empty your bladder before and after sex.  Pay attention to any changes in your symptoms. Tell your health care provider about any changes or any new symptoms.  It is your responsibility to get your test results. Ask your health care provider, or the department performing the test, when your results will be ready.  Keep all follow-up visits as told by your health care provider. This is important. Contact a health care provider if:  You develop back pain.  You have a fever.  You have nausea or vomiting.  Your symptoms do not improve after 3 days.  Your symptoms get worse. Get help right away if:  You develop severe vomiting and are unable take medicine without vomiting.  You develop severe pain in your back or abdomen even though you are taking medicine.  You pass a large amount of blood in your urine.  You pass blood clots in your urine.  You feel very weak or like you might faint.  You faint. Summary  Hematuria is blood in the urine. It has many possible causes.  It is very important that you tell your health care provider about any blood in your urine, even if it is painless or the blood stops without treatment.  Take over-the-counter and prescription medicines only as told by your health care provider.  Drink enough fluid to keep   your urine clear or pale yellow. This information is not intended to replace advice given to you by your health care provider. Make sure you discuss any questions you have with your health care provider. Document Revised: 01/27/2019 Document Reviewed: 10/05/2016 Elsevier Patient Education  2020 [employer]  

## 2020-04-19 NOTE — Progress Notes (Signed)
04/19/2020 9:22 AM   Stanley Fernandez Mar 09, 1971 [id number]  Referring provider: Sherlene Shams, MD [address] [place],  [place] [address]  Chief Complaint  Patient presents with  . Cysto    HPI:  F/u -   LUTS and hematuria - he noted in 06/21 decreased urine output, brown urine, hesitancy and a slow stream.  He then increased his fluid intake and noted some frequency.  He had a good lab evaluation with a normal urinalysis, negative culture, white count of 6.9, creatinine 0.86 and PSA of 0.4. He had some diarrhea and constipation rotation. He had MH on UA with 3-10 rbc. AUASS = 20. He has a h/o bright's disease. He had some left flank pain recently. No stone passage. Non-smoker. No FH PCa.   He is a Futures trader. He changes his diet.   He returns to review CT and for cystoscopy. CT 04/12/2020 was benign. I reviewed all the images. Prostate small.  He has not had further gross hematuria.  PMH: Past Medical History:  Diagnosis Date  . allergic rhinitis    managed with antihistamine  . Bright's disease   . Chicken pox   . Hypertension   . Obesity (BMI 30-39.9)   . Sleep apnea, obstructive    last study 2009  . Traumatic hematoma of right lower leg 04/08/2017   Repeat aspiration October 03, 1998 1970 cc Repeat aspiration Feb 13, 1999 1970 cc    Surgical History: Past Surgical History:  Procedure Laterality Date  . FINGER NAIL SURGERY    . MEDIAL COLLATERAL LIGAMENT AND LATERAL COLLATERAL LIGAMENT REPAIR, KNEE  2007  . TONSILLECTOMY AND ADENOIDECTOMY  2004    Home Medications:  Allergies as of 04/19/2020   No Known Allergies     Medication List       Accurate as of April 19, 2020  9:22 AM. If you have any questions, ask your nurse or doctor.        acetaminophen 500 MG tablet Commonly known as: TYLENOL Take 1,000 mg by mouth every 6 (six) hours as needed.   cetirizine 10 MG tablet Commonly known as: ZYRTEC Take 10 mg by mouth daily.     cholecalciferol 25 MCG (1000 UNIT) tablet Commonly known as: VITAMIN D3 Take 1,000 Units by mouth daily.   Fish Oil 600 MG Caps Take 1 capsule by mouth 2 (two) times daily.   PRESCRIPTION MEDICATION Apply 1 application topically daily as needed. Shin   tamsulosin 0.4 MG Caps capsule Commonly known as: FLOMAX Take 1 capsule (0.4 mg total) by mouth daily after supper.   vitamin B-12 1000 MCG tablet Commonly known as: CYANOCOBALAMIN Take 1,000 mcg by mouth daily.       Allergies: No Known Allergies  Family History: Family History  Problem Relation Age of Onset  . Cancer Mother 14       breast, mets to brain   . Heart disease Father 66       AMI  . Heart attack Father     Social History:  reports that he has never smoked. He quit smokeless tobacco use about 17 years ago. He reports current alcohol use. He reports that he does not use drugs.     04/19/20  CC:  Chief Complaint  Patient presents with  . Cysto    HPI:  Blood pressure (!) 156/87, pulse 82, height 5\' 9"  (1.753 m), weight (!) 346 lb (156.9 kg). NED. A&Ox3.   No  respiratory distress   Abd soft, NT, ND Normal phallus with bilateral descended testicles  Cystoscopy Procedure Note  Patient identification was confirmed, informed consent was obtained, and patient was prepped using Betadine solution.  Lidocaine jelly was administered per urethral meatus.     Pre-Procedure: - Inspection reveals a normal caliber ureteral meatus.  Procedure: The flexible cystoscope was introduced without difficulty - No urethral strictures/lesions are present. - normal prostate  - normal bladder neck - Bilateral ureteral orifices identified - Bladder mucosa  reveals no ulcers, tumors, or lesions - No bladder stones - No trabeculation  Retroflexion shows normal bladder/bladder neck   Crysta was chaperone.   Post-Procedure: - Patient tolerated the procedure well    Laboratory Data: Lab Results  Component  Value Date   WBC 6.9 03/21/2020   HGB 15.0 03/21/2020   HCT 44.7 03/21/2020   MCV 88.0 03/21/2020   PLT 244.0 03/21/2020    Lab Results  Component Value Date   CREATININE 0.86 03/21/2020    Lab Results  Component Value Date   PSA 0.40 03/21/2020    No results found for: TESTOSTERONE  Lab Results  Component Value Date   HGBA1C 6.1 09/22/2019    Urinalysis    Component Value Date/Time   COLORURINE YELLOW 06/19/2015 0907   APPEARANCEUR Clear 03/24/2020 1030   LABSPEC 1.025 06/19/2015 0907   PHURINE 6.0 06/19/2015 0907   GLUCOSEU Negative 03/24/2020 1030   GLUCOSEU NEGATIVE 06/19/2015 0907   HGBUR NEGATIVE 06/19/2015 0907   BILIRUBINUR Negative 03/24/2020 1030   KETONESUR NEGATIVE 06/19/2015 0907   PROTEINUR 1+ (A) 03/24/2020 1030   UROBILINOGEN negative (A) 03/21/2020 1314   UROBILINOGEN 0.2 06/19/2015 0907   NITRITE Negative 03/24/2020 1030   NITRITE NEGATIVE 06/19/2015 0907   LEUKOCYTESUR Negative 03/24/2020 1030    Lab Results  Component Value Date   LABMICR See below: 03/24/2020   WBCUA 0-5 03/24/2020   LABEPIT 0-10 03/24/2020   BACTERIA None seen 03/24/2020    Pertinent Imaging: CT No results found for this or any previous visit.  No results found for this or any previous visit.  No results found for this or any previous visit.  No results found for this or any previous visit.  No results found for this or any previous visit.  No results found for this or any previous visit.  No results found for this or any previous visit.  No results found for this or any previous visit.   Assessment & Plan:    1. Microscopic hematuria Benign eval. WIll see patient back in 1 year for exam and UA. He will notify me if any recurrence.  - Urinalysis, Complete   No follow-ups on file.  Jerilee Field, MD  [facility] [address], [address] [place], [place] [address] [phone]

## 2020-04-21 LAB — URINALYSIS, COMPLETE
Bilirubin, UA: NEGATIVE
Glucose, UA: NEGATIVE
Leukocytes,UA: NEGATIVE
Nitrite, UA: NEGATIVE
Protein,UA: NEGATIVE
RBC, UA: NEGATIVE
Specific Gravity, UA: 1.02 (ref 1.005–1.030)
Urobilinogen, Ur: 0.2 mg/dL (ref 0.2–1.0)
pH, UA: 5.5 (ref 5.0–7.5)

## 2020-04-21 LAB — MICROSCOPIC EXAMINATION
Bacteria, UA: NONE SEEN
RBC, Urine: NONE SEEN /hpf (ref 0–2)

## 2020-05-16 ENCOUNTER — Encounter: Payer: Self-pay | Admitting: Emergency Medicine

## 2020-05-16 ENCOUNTER — Other Ambulatory Visit: Payer: Self-pay

## 2020-05-16 ENCOUNTER — Ambulatory Visit
Admission: EM | Admit: 2020-05-16 | Discharge: 2020-05-16 | Disposition: A | Discharge status: Home / Self Care | Payer: BC Managed Care – PPO | Attending: Emergency Medicine | Admitting: Emergency Medicine

## 2020-05-16 DIAGNOSIS — Z20822 Contact with and (suspected) exposure to covid-19: Secondary | ICD-10-CM

## 2020-05-16 DIAGNOSIS — B349 Viral infection, unspecified: Secondary | ICD-10-CM | POA: Diagnosis not present

## 2020-05-16 DIAGNOSIS — U071 COVID-19: Secondary | ICD-10-CM | POA: Insufficient documentation

## 2020-05-16 LAB — SARS CORONAVIRUS 2 (TAT 6-24 HRS): SARS Coronavirus 2: POSITIVE — AB

## 2020-05-16 MED ORDER — BENZONATATE 200 MG PO CAPS: 200.0000 mg | ORAL_CAPSULE | Freq: Three times a day (TID) | ORAL | 0 refills | Status: DC | PRN

## 2020-05-16 MED ORDER — FLUTICASONE PROPIONATE 50 MCG/ACT NA SUSP: 2.0000 | Freq: Every day | NASAL | 0 refills | Status: DC

## 2020-05-16 MED ORDER — IBUPROFEN 600 MG PO TABS: 600.0000 mg | ORAL_TABLET | Freq: Four times a day (QID) | ORAL | 0 refills | Status: DC | PRN

## 2020-05-16 NOTE — ED Provider Notes (Signed)
HPI  SUBJECTIVE:  Stanley Fernandez is a 49 y.o. male who presents with  a "head cold" for 6 days.  He reports cough, loss of sense of smell and taste starting 2 days ago.  He reports nasal congestion, clear rhinorrhea, chest congestion, cough productive of whitish phlegm, chest congestion.  Wife tested positive for Covid last week.  No body aches, headaches, fevers, sore throat.  No wheezing, shortness of breath, nausea, vomiting, diarrhea, abdominal pain.  States that he is sleeping at night without much problem.  He did not get the Covid vaccine.  No antipyretic in the past 6 hours.  He has tried vitamin C, D, zinc and Tylenol PM.  No aggravating or alleviating factors.  He has a past medical history of obstructive sleep apnea on CPAP.  No history of diabetes, hypertension, pulmonary disease, chronic kidney disease, coronary artery disease, HIV, cancer, immunocompromise.  WUJ:WJXBJ, Mar Daring, MD   Past Medical History:  Diagnosis Date  . allergic rhinitis    managed with antihistamine  . Bright's disease   . Chicken pox   . Hypertension   . Obesity (BMI 30-39.9)   . Sleep apnea, obstructive    last study 2009  . Traumatic hematoma of right lower leg 04/08/2017   Repeat aspiration October 03, 1998 1970 cc Repeat aspiration Feb 13, 1999 1970 cc    Past Surgical History:  Procedure Laterality Date  . FINGER NAIL SURGERY    . MEDIAL COLLATERAL LIGAMENT AND LATERAL COLLATERAL LIGAMENT REPAIR, KNEE  2007  . TONSILLECTOMY AND ADENOIDECTOMY  2004    Family History  Problem Relation Age of Onset  . Cancer Mother 81       breast, mets to brain   . Heart disease Father 42       AMI  . Heart attack Father     Social History   Tobacco Use  . Smoking status: Never Smoker  . Smokeless tobacco: Former Engineer, water Use Topics  . Alcohol use: Yes    Comment: rare  . Drug use: No    No current facility-administered medications for this encounter.  Current Outpatient Medications:   .  acetaminophen (TYLENOL) 500 MG tablet, Take 1,000 mg by mouth every 6 (six) hours as needed., Disp: , Rfl:  .  cholecalciferol (VITAMIN D3) 25 MCG (1000 UT) tablet, Take 1,000 Units by mouth daily., Disp: , Rfl:  .  Omega-3 Fatty Acids (FISH OIL) 600 MG CAPS, Take 1 capsule by mouth 2 (two) times daily., Disp: , Rfl:  .  benzonatate (TESSALON) 200 MG capsule, Take 1 capsule (200 mg total) by mouth 3 (three) times daily as needed for cough., Disp: 30 capsule, Rfl: 0 .  fluticasone (FLONASE) 50 MCG/ACT nasal spray, Place 2 sprays into both nostrils daily., Disp: 16 g, Rfl: 0 .  ibuprofen (ADVIL) 600 MG tablet, Take 1 tablet (600 mg total) by mouth every 6 (six) hours as needed., Disp: 30 tablet, Rfl: 0 .  PRESCRIPTION MEDICATION, Apply 1 application topically daily as needed. Shin, Disp: , Rfl:   No Known Allergies   ROS  As noted in HPI.   Physical Exam  BP 126/90 (BP Location: Right Arm)   Pulse 85   Temp 98.8 F (37.1 C) (Oral)   Resp 18   Ht 5\' 9"  (1.753 m)   SpO2 99%   BMI 51.10 kg/m   Constitutional: Well developed, well nourished, no acute distress Eyes:  EOMI, conjunctiva normal bilaterally HENT:  Normocephalic, atraumatic,mucus membranes moist.  Positive nasal congestion.  No sinus tenderness.  Positive postnasal drip.  Normal oropharynx. Respiratory: Normal inspiratory effort lungs clear bilaterally Cardiovascular: Normal rate regular rhythm no murmurs rubs or gallops GI: nondistended skin: No rash, skin intact Musculoskeletal: no deformities Neurologic: Alert & oriented x 3, no focal neuro deficits Psychiatric: Speech and behavior appropriate   ED Course   Medications - No data to display  Orders Placed This Encounter  Procedures  . SARS CORONAVIRUS 2 (TAT 6-24 HRS) Nasopharyngeal Nasopharyngeal Swab    Standing Status:   Standing    Number of Occurrences:   1    Order Specific Question:   Is this test for diagnosis or screening    Answer:   Diagnosis of  ill patient    Order Specific Question:   Symptomatic for COVID-19 as defined by CDC    Answer:   Yes    Order Specific Question:   Date of Symptom Onset    Answer:   05/14/2020    Order Specific Question:   Hospitalized for COVID-19    Answer:   No    Order Specific Question:   Admitted to ICU for COVID-19    Answer:   No    Order Specific Question:   Previously tested for COVID-19    Answer:   Yes    Order Specific Question:   Resident in a congregate (group) care setting    Answer:   No    Order Specific Question:   Employed in healthcare setting    Answer:   No    Order Specific Question:   Has patient completed COVID vaccination(s) (2 doses of Pfizer/Moderna 1 dose of [employer])    Answer:   Unknown    No results found for this or any previous visit (from the past 24 hour(s)). No results found.  ED Clinical Impression  1. COVID-19 virus infection   2. Close exposure to COVID-19 virus   3. Encounter for laboratory testing for COVID-19 virus      ED Assessment/Plan  Patient with close exposure to Covid.  Suspect Covid.  He will be infusion candidate.  Covid PCR sent.  Home with Mucinex D, Flonase, saline nasal irrigation, Tessalon, Tylenol/ibuprofen.  Advised pulse oximeter.  Follow-up with PMD as needed, to the ER if he gets worse.  Patient Covid positive.  Sent secure message to [facility] infusion clinic.  Discussed labs, MDM, treatment plan, and plan for follow-up with patient. Discussed sn/sx that should prompt return to the ED. patient agrees with plan.   Meds ordered this encounter  Medications  . benzonatate (TESSALON) 200 MG capsule    Sig: Take 1 capsule (200 mg total) by mouth 3 (three) times daily as needed for cough.    Dispense:  30 capsule    Refill:  0  . ibuprofen (ADVIL) 600 MG tablet    Sig: Take 1 tablet (600 mg total) by mouth every 6 (six) hours as needed.    Dispense:  30 tablet    Refill:  0  . fluticasone (FLONASE) 50 MCG/ACT nasal spray     Sig: Place 2 sprays into both nostrils daily.    Dispense:  16 g    Refill:  0    *This clinic note was created using Scientist, clinical (histocompatibility and immunogenetics). Therefore, there may be occasional mistakes despite careful proofreading.   ?    Domenick Gong, MD 05/17/20 1317

## 2020-05-16 NOTE — Discharge Instructions (Addendum)
By a pulse oximeter to measure your pulse ox at home.  Monitor your wife's as well. Start Mucinex-D to keep the mucous thin and to decongest you. You may take 600 mg of motrin with 1 gram of tylenol up to 3-4 times a day as needed for pain. This is an effective combination for pain.  Start Flonase.  Use a NeilMed sinus rinse as often as you want to to reduce nasal congestion. Follow the directions on the box.   You can try vitamin C 500 mg twice a day, vitamin D3 5000 international units once daily, 50 to 75 mg of zinc once a day.  Listerene may help decrease the spread of Covid.   Go to www.goodrx.com to look up your medications. This will give you a list of where you can find your prescriptions at the most affordable prices. Or you can ask the pharmacist what the cash price is. This is frequently cheaper than going through insurance.

## 2020-05-16 NOTE — ED Triage Notes (Signed)
Patient had positive exposure to COVID. His wife tested positive last week. He states he started having cough, nasal congestion and night sweats x 6 days ago.

## 2020-05-17 ENCOUNTER — Telehealth: Payer: Self-pay | Admitting: Infectious Diseases

## 2020-05-17 NOTE — Telephone Encounter (Signed)
Called to Discuss with patient about Covid symptoms and the use of the monoclonal antibody infusion for those with mild to moderate Covid symptoms and at a high risk of hospitalization.     Pt appears to qualify for this infusion due to co-morbid conditions and/or a member of an at-risk group in accordance with the FDA Emergency Use Authorization.   Estimated body mass index is 51.1 kg/m as calculated from the following:   Height as of 05/16/20: 5\' 9"  (1.753 m).   Weight as of 04/19/20: 346 lb (156.9 kg).    Sx started last Wednesday 05/10/2020. His wife tested positive recently and has recovered. He has had no fevers and only has some night sweats. He just has a stuffy nose at this time and is feeling better now on day 7. He would like to discuss with his PCP and take some time to think about it.   I asked him to please let me know tomorrow if he can to ensure we can get him in for an appointment.    05/12/2020, MSN, NP-C [facility]  [email or web address] Pager: [phone] Office: [phone] [facility] Main Line: [phone]

## 2020-05-18 ENCOUNTER — Telehealth: Payer: Self-pay | Admitting: Internal Medicine

## 2020-05-18 ENCOUNTER — Other Ambulatory Visit: Payer: Self-pay | Admitting: Infectious Diseases

## 2020-05-18 DIAGNOSIS — U071 COVID-19: Secondary | ICD-10-CM

## 2020-05-18 DIAGNOSIS — G4733 Obstructive sleep apnea (adult) (pediatric): Secondary | ICD-10-CM

## 2020-05-18 NOTE — Telephone Encounter (Signed)
Pt called back and was advised that Dr. Darrick Huntsman does agree with the infusion. Pt gave a verbal understanding.

## 2020-05-18 NOTE — Progress Notes (Signed)
I connected by phone with Stanley Fernandez on 05/18/2020 at 5:00 PM to discuss the potential use of a new treatment for mild to moderate COVID-19 viral infection in non-hospitalized patients.  This patient is a 49 y.o. male that meets the FDA criteria for Emergency Use Authorization of COVID monoclonal antibody casirivimab/imdevimab.  Has a (+) direct SARS-CoV-2 viral test result  Has mild or moderate COVID-19   Is NOT hospitalized due to COVID-19  Is within 10 days of symptom onset  Has at least one of the high risk factor(s) for progression to severe COVID-19 and/or hospitalization as defined in EUA.  Specific high risk criteria : BMI > 25 and Chronic Lung Disease   I have spoken and communicated the following to the patient or parent/caregiver regarding COVID monoclonal antibody treatment:  1. FDA has authorized the emergency use for the treatment of mild to moderate COVID-19 in adults and pediatric patients with positive results of direct SARS-CoV-2 viral testing who are 47 years of age and older weighing at least 40 kg, and who are at high risk for progressing to severe COVID-19 and/or hospitalization.  2. The significant known and potential risks and benefits of COVID monoclonal antibody, and the extent to which such potential risks and benefits are unknown.  3. Information on available alternative treatments and the risks and benefits of those alternatives, including clinical trials.  4. Patients treated with COVID monoclonal antibody should continue to self-isolate and use infection control measures (e.g., wear mask, isolate, social distance, avoid sharing personal items, clean and disinfect "high touch" surfaces, and frequent handwashing) according to CDC guidelines.   5. The patient or parent/caregiver has the option to accept or refuse COVID monoclonal antibody treatment.  After reviewing this information with the patient, The patient agreed to proceed with receiving  casirivimab\imdevimab infusion and will be provided a copy of the Fact sheet prior to receiving the infusion. [name] 05/18/2020 5:00 PM

## 2020-05-18 NOTE — Telephone Encounter (Signed)
Mychart message sent since unable to reach pt by phone.

## 2020-05-18 NOTE — Telephone Encounter (Signed)
I agree with the infusion

## 2020-05-18 NOTE — Telephone Encounter (Signed)
LMTCB

## 2020-05-18 NOTE — Telephone Encounter (Signed)
Patient called in stated that he has tested 2 day ago positive for covid and they offered him the infusion wanted to know what Dr.Tullo opinion on the infusion they are waiting on he response to set up appointment

## 2020-05-19 ENCOUNTER — Ambulatory Visit (HOSPITAL_COMMUNITY)
Admission: RE | Admit: 2020-05-19 | Discharge: 2020-05-19 | Disposition: A | Discharge status: Home / Self Care | Payer: BC Managed Care – PPO | Source: Ambulatory Visit | Attending: Cardiology | Admitting: Cardiology

## 2020-05-19 DIAGNOSIS — G4733 Obstructive sleep apnea (adult) (pediatric): Secondary | ICD-10-CM

## 2020-05-19 DIAGNOSIS — U071 COVID-19: Secondary | ICD-10-CM | POA: Diagnosis present

## 2020-05-19 MED ORDER — SODIUM CHLORIDE 0.9 % IV SOLN: INTRAVENOUS | Status: DC | PRN

## 2020-05-19 MED ORDER — SODIUM CHLORIDE 0.9 % IV SOLN
1200.0000 mg | Freq: Once | INTRAVENOUS | Status: AC
  Administered 2020-05-19: 1200 mg via INTRAVENOUS
  Filled 2020-05-19: qty 10

## 2020-05-19 MED ORDER — ALBUTEROL SULFATE HFA 108 (90 BASE) MCG/ACT IN AERS: 2.0000 | INHALATION_SPRAY | Freq: Once | RESPIRATORY_TRACT | Status: DC | PRN

## 2020-05-19 MED ORDER — EPINEPHRINE 0.3 MG/0.3ML IJ SOAJ: 0.3000 mg | Freq: Once | INTRAMUSCULAR | Status: DC | PRN

## 2020-05-19 MED ORDER — DIPHENHYDRAMINE HCL 50 MG/ML IJ SOLN: 50.0000 mg | Freq: Once | INTRAMUSCULAR | Status: DC | PRN

## 2020-05-19 MED ORDER — METHYLPREDNISOLONE SODIUM SUCC 125 MG IJ SOLR: 125.0000 mg | Freq: Once | INTRAMUSCULAR | Status: DC | PRN

## 2020-05-19 MED ORDER — FAMOTIDINE IN NACL 20-0.9 MG/50ML-% IV SOLN: 20.0000 mg | Freq: Once | INTRAVENOUS | Status: DC | PRN

## 2020-05-19 NOTE — Discharge Instructions (Signed)

## 2020-05-19 NOTE — Progress Notes (Addendum)
  Diagnosis: COVID-19  Physician: Dr. Patrick Wright  Procedure: Covid Infusion Clinic Med: casirivimab\imdevimab infusion - Provided patient with casirivimab\imdevimab fact sheet for patients, parents and caregivers prior to infusion.  Complications: No immediate complications noted.  Discharge: Discharged home   Ally Yow 05/19/2020   

## 2020-10-13 ENCOUNTER — Encounter: Payer: Self-pay | Admitting: Internal Medicine

## 2020-10-13 ENCOUNTER — Telehealth (INDEPENDENT_AMBULATORY_CARE_PROVIDER_SITE_OTHER): Payer: BC Managed Care – PPO | Admitting: Internal Medicine

## 2020-10-13 VITALS — Ht 69.0 in | Wt 248.0 lb

## 2020-10-13 DIAGNOSIS — E559 Vitamin D deficiency, unspecified: Secondary | ICD-10-CM | POA: Diagnosis not present

## 2020-10-13 DIAGNOSIS — R200 Anesthesia of skin: Secondary | ICD-10-CM

## 2020-10-13 DIAGNOSIS — Z1211 Encounter for screening for malignant neoplasm of colon: Secondary | ICD-10-CM

## 2020-10-13 DIAGNOSIS — Z87448 Personal history of other diseases of urinary system: Secondary | ICD-10-CM

## 2020-10-13 DIAGNOSIS — R202 Paresthesia of skin: Secondary | ICD-10-CM

## 2020-10-13 DIAGNOSIS — R7303 Prediabetes: Secondary | ICD-10-CM

## 2020-10-13 DIAGNOSIS — I890 Lymphedema, not elsewhere classified: Secondary | ICD-10-CM

## 2020-10-13 DIAGNOSIS — Z87898 Personal history of other specified conditions: Secondary | ICD-10-CM

## 2020-10-13 NOTE — Assessment & Plan Note (Signed)
S/p urology evaluation with cystoscopy which was normal.  No recurrence.  Repeat UA with next lab visit

## 2020-10-13 NOTE — Assessment & Plan Note (Signed)
Lab Results  Component Value Date   HGBA1C 6.1 09/22/2019   He has lost weight intentionally with a carbohydrate restricted diet. Will recheck a1c annually

## 2020-10-13 NOTE — Assessment & Plan Note (Addendum)
Post trauamatic,  Left leg.  Encouraged to try pumping in the morning BEFORE the walk to see if the foot  numbness improves.  Pumping twice daily recommended.  Using compression stockings

## 2020-10-13 NOTE — Patient Instructions (Addendum)
Fasting labs have been ordered along with a urinalysis for you to do  at your leisure. .  You can send me a message , if you prefer, so that my nurse can set up your lab appointment   Try pumping FIRST for a week to see if the numbness improves   Referral to [facility] for screening colonoscopy is in progress

## 2020-10-13 NOTE — Assessment & Plan Note (Signed)
I have congratulated him  in reduction of   BMI and encouraged  Continued weight loss with goal of 10% of body weight over 6 months period  using a low glycemic index diet and regular exercise a minimum of 5 days per week.

## 2020-10-13 NOTE — Progress Notes (Signed)
Virtual Visit CONVERTED TO TELEPHONE   This visit type was conducted due to national recommendations for restrictions regarding the COVID-19 pandemic (e.g. social distancing).  This format is felt to be most appropriate for this patient at this time.  All issues noted in this document were discussed and addressed.  No physical exam was performed (except for noted visual exam findings with Video Visits).   I connected with@ on 10/13/20 at  8:30 AM EST by a video enabled telemedicine application  and verified that I am speaking with the correct person using two identifiers. Location patient: home Location provider: work or home office Persons participating in the virtual visit: patient, provider  I discussed the limitations, risks, security and privacy concerns of performing an evaluation and management service by telephone and the availability of in person appointments. I also discussed with the patient that there may be a patient responsible charge related to this service. The patient expressed understanding and agreed to proceed.  Interactive audio and video telecommunications were attempted between this provider and patient, however failed, due to patient having technical difficultieS.  We continued and completed visit with audio only.   Reason for visit: follow up  HPI:  Stanley Fernandez is a 50 yr old male with a history of morbid obesity, prediabetes,  OSA on CPAP who presents for follow up on multiple issues:  1) Left leg swelling;  Diagnosed with post traumatic lymphedema by [facility].  Using compression stockings and pumping once daily. Notes numbness of left foot in the morning usually  And later on in the day.  Pumps in the mornign after his early morning walk.  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   2) Morbid Obeisty:  Dropped 50 lbs ,  Has gained 10 back.  Low GI diet   3) Hematuria:  Referred to Urology: prostate and bladder normal via cystoscopy. No  recurrence.  Urinary stream improved   4) Colon CA screening   Discussed options.  New family history has surfaced in discussion with sister: maternal uncle and grandather had "intestinal tumors" found at death. Referring or colonoscopy recommended.   ROS: See pertinent positives and negatives per HPI.  Past Medical History:  Diagnosis Date  . allergic rhinitis    managed with antihistamine  . Bright's disease   . Chicken pox   . Hypertension   . Obesity (BMI 30-39.9)   . Sleep apnea, obstructive    last study 2009  . Traumatic hematoma of right lower leg 04/08/2017   Repeat aspiration October 03, 1998 1970 cc Repeat aspiration Feb 13, 1999 1970 cc    Past Surgical History:  Procedure Laterality Date  . FINGER NAIL SURGERY    . MEDIAL COLLATERAL LIGAMENT AND LATERAL COLLATERAL LIGAMENT REPAIR, KNEE  2007  . TONSILLECTOMY AND ADENOIDECTOMY  2004    Family History  Problem Relation Age of Onset  . Cancer Mother 30       breast, mets to brain   . Heart disease Father 25       AMI  . Heart attack Father   . Colon cancer Maternal Grandfather     SOCIAL HX:  reports that he has never smoked. He quit smokeless tobacco use about 18 years ago. He reports current alcohol use. He reports that he does not use drugs.   Current Outpatient Medications:  .  acetaminophen (TYLENOL) 500 MG tablet, Take 1,000 mg by mouth every 6 (six) hours as needed., Disp: ,  Rfl:  .  benzonatate (TESSALON) 200 MG capsule, Take 1 capsule (200 mg total) by mouth 3 (three) times daily as needed for cough., Disp: 30 capsule, Rfl: 0 .  cholecalciferol (VITAMIN D3) 25 MCG (1000 UT) tablet, Take 1,000 Units by mouth daily., Disp: , Rfl:  .  fluticasone (FLONASE) 50 MCG/ACT nasal spray, Place 2 sprays into both nostrils daily., Disp: 16 g, Rfl: 0 .  ibuprofen (ADVIL) 600 MG tablet, Take 1 tablet (600 mg total) by mouth every 6 (six) hours as needed., Disp: 30 tablet, Rfl: 0 .  Omega-3 Fatty Acids (FISH OIL) 600  MG CAPS, Take 1 capsule by mouth 2 (two) times daily., Disp: , Rfl:  .  PRESCRIPTION MEDICATION, Apply 1 application topically daily as needed. Shin, Disp: , Rfl:   EXAM:   General impression: alert, cooperative and articulate.  No signs of being in distress  Lungs: speech is fluent sentence length suggests that patient is not short of breath and not punctuated by cough, sneezing or sniffing. Marland Kitchen   Psych: affect normal.  speech is articulate and non pressured .  Denies suicidal thoughts   ASSESSMENT AND PLAN:  Discussed the following assessment and plan:  Colon cancer screening - Plan: Ambulatory referral to Gastroenterology  History of gross hematuria - Plan: Urinalysis, Routine w reflex microscopic  Vitamin D deficiency - Plan: VITAMIN D 25 Hydroxy (Vit-D Deficiency, Fractures)  Prediabetes - Plan: Hemoglobin A1c, Comprehensive metabolic panel, Lipid panel, Microalbumin / creatinine urine ratio  Numbness and tingling of foot - Plan: Vitamin B12, TSH  Lymphedema  Morbid obesity (HCC)  Lymphedema Post trauamatic,  Left leg.  Encouraged to try pumping in the morning BEFORE the walk to see if the foot  numbness improves.  Pumping twice daily recommended.  Using compression stockings   History of gross hematuria S/p urology evaluation with cystoscopy which was normal.  No recurrence.  Repeat UA with next lab visit   Morbid obesity I have congratulated him  in reduction of   BMI and encouraged  Continued weight loss with goal of 10% of body weight over 6 months period  using a low glycemic index diet and regular exercise a minimum of 5 days per week.    Prediabetes Lab Results  Component Value Date   HGBA1C 6.1 09/22/2019   He has lost weight intentionally with a carbohydrate restricted diet. Will recheck a1c annually     I discussed the assessment and treatment plan with the patient. The patient was provided an opportunity to ask questions and all were answered. The patient  agreed with the plan and demonstrated an understanding of the instructions.   The patient was advised to call back or seek an in-person evaluation if the symptoms worsen or if the condition fails to improve as anticipated.  Video connection was lost at < 50% of the duration of the visit ,  At which time the remainder of the visit was completed via audio only.   I provided  30  minutes of non-face-to-face time during this encounter reviewing patient's current problems and post surgeries.  Providing counseling on the above mentioned problems , and coordination  of care . Sherlene Shams, MD

## 2021-03-27 ENCOUNTER — Ambulatory Visit (INDEPENDENT_AMBULATORY_CARE_PROVIDER_SITE_OTHER): Payer: BC Managed Care – PPO | Admitting: Vascular Surgery

## 2021-04-03 ENCOUNTER — Encounter (INDEPENDENT_AMBULATORY_CARE_PROVIDER_SITE_OTHER): Payer: Self-pay | Admitting: Vascular Surgery

## 2021-04-03 ENCOUNTER — Ambulatory Visit (INDEPENDENT_AMBULATORY_CARE_PROVIDER_SITE_OTHER): Payer: BC Managed Care – PPO | Admitting: Vascular Surgery

## 2021-04-03 ENCOUNTER — Other Ambulatory Visit: Payer: Self-pay

## 2021-04-03 VITALS — BP 151/88 | HR 60 | Resp 16 | Wt 349.0 lb

## 2021-04-03 DIAGNOSIS — I890 Lymphedema, not elsewhere classified: Secondary | ICD-10-CM

## 2021-04-03 NOTE — Progress Notes (Signed)
MRN : [id number]  Stanley Fernandez is a 50 y.o. ([birth year]) male who presents with chief complaint of  Chief Complaint  Patient presents with   Follow-up    18yr follow up  .  History of Present Illness: Patient returns today in follow up of his lymphedema and leg swelling.  He is doing reasonably well.  He has not been using his lymphedema pump daily but uses it a few times a week.  He has been using his compression stockings daily.  He is elevating his legs.  He is still working long hours and has not really been exercising.  No new ulceration or infection.  No weeping of the tissue.  No fever or chills  Current Outpatient Medications  Medication Sig Dispense Refill   acetaminophen (TYLENOL) 500 MG tablet Take 1,000 mg by mouth every 6 (six) hours as needed.     cholecalciferol (VITAMIN D3) 25 MCG (1000 UT) tablet Take 1,000 Units by mouth daily.     fluticasone (FLONASE) 50 MCG/ACT nasal spray Place 2 sprays into both nostrils daily. 16 g 0   Omega-3 Fatty Acids (FISH OIL) 600 MG CAPS Take 1 capsule by mouth daily as needed.     benzonatate (TESSALON) 200 MG capsule Take 1 capsule (200 mg total) by mouth 3 (three) times daily as needed for cough. (Patient not taking: Reported on 04/03/2021) 30 capsule 0   ibuprofen (ADVIL) 600 MG tablet Take 1 tablet (600 mg total) by mouth every 6 (six) hours as needed. (Patient not taking: Reported on 04/03/2021) 30 tablet 0   PRESCRIPTION MEDICATION Apply 1 application topically daily as needed. Evette Cristal (Patient not taking: Reported on 04/03/2021)     No current facility-administered medications for this visit.    Past Medical History:  Diagnosis Date   allergic rhinitis    managed with antihistamine   Bright's disease    Chicken pox    Hypertension    Obesity (BMI 30-39.9)    Sleep apnea, obstructive    last study 2009   Traumatic hematoma of right lower leg 04/08/2017   Repeat aspiration October 03, 1998 1970 cc Repeat aspiration Feb 13, 1999 1970 cc    Past Surgical History:  Procedure Laterality Date   FINGER NAIL SURGERY     MEDIAL COLLATERAL LIGAMENT AND LATERAL COLLATERAL LIGAMENT REPAIR, KNEE  2007   TONSILLECTOMY AND ADENOIDECTOMY  2004     Social History   Tobacco Use   Smoking status: Never   Smokeless tobacco: Former    Quit date: 05/25/2002  Substance Use Topics   Alcohol use: Yes    Comment: rare   Drug use: No      Family History  Problem Relation Age of Onset   Cancer Mother 31       breast, mets to brain    Heart disease Father 72       AMI   Heart attack Father    Colon cancer Maternal Grandfather      No Known Allergies    REVIEW OF SYSTEMS (Negative unless checked)   Constitutional: [] Weight loss  [] Fever  [] Chills Cardiac: [] Chest pain   [] Chest pressure   [] Palpitations   [] Shortness of breath when laying flat   [] Shortness of breath at rest   [] Shortness of breath with exertion. Vascular:  [x] Pain in legs with walking   [] Pain in legs at rest   [] Pain in legs when laying flat   [] Claudication   [] Pain  in feet when walking  [] Pain in feet at rest  [] Pain in feet when laying flat   [] History of DVT   [] Phlebitis   [x] Swelling in legs   [] Varicose veins   [] Non-healing ulcers Pulmonary:   [] Uses home oxygen   [] Productive cough   [] Hemoptysis   [] Wheeze  [] COPD   [] Asthma Neurologic:  [] Dizziness  [] Blackouts   [] Seizures   [] History of stroke   [] History of TIA  [] Aphasia   [] Temporary blindness   [] Dysphagia   [] Weakness or numbness in arms   [] Weakness or numbness in legs Musculoskeletal:  [] Arthritis   [] Joint swelling   [x] Joint pain   [] Low back pain Hematologic:  [] Easy bruising  [] Easy bleeding   [] Hypercoagulable state   [] Anemic  [] Hepatitis Gastrointestinal:  [] Blood in stool   [] Vomiting blood  [] Gastroesophageal reflux/heartburn   [] Abdominal pain Genitourinary:  [] Chronic kidney disease   [] Difficult urination  [] Frequent urination  [] Burning with urination    [] Hematuria Skin:  [] Rashes   [] Ulcers   [] Wounds Psychological:  [] History of anxiety   []  History of major depression.  Physical Examination  BP (!) 151/88 (BP Location: Left Arm)   Pulse 60   Resp 16   Wt (!) 349 lb (158.3 kg)   BMI 51.54 kg/m  Gen:  WD/WN, NAD.  Morbidly obese Head: [place]/AT, No temporalis wasting. Ear/Nose/Throat: Hearing grossly intact, nares w/o erythema or drainage Eyes: Conjunctiva clear. Sclera non-icteric Neck: Supple.  Trachea midline Pulmonary:  Good air movement, no use of accessory muscles.  Cardiac: RRR, no JVD Vascular:  Vessel Right Left  Radial Palpable Palpable                       Musculoskeletal: M/S 5/5 throughout.  No deformity or atrophy. 1-2+ RLE edema, 1+ LLE edema. Neurologic: Sensation grossly intact in extremities.  Symmetrical.  Speech is fluent.  Psychiatric: Judgment intact, Mood & affect appropriate for pt's clinical situation. Dermatologic: No rashes or ulcers noted.  No cellulitis or open wounds.      Labs No results found for this or any previous visit (from the past 2160 hour(s)).  Radiology No results found.  Assessment/Plan  Morbid obesity This worsens his lower extremity swelling and weight loss would be of benefit.  Lymphedema Symptom control is reasonably good with compression socks and elevation.  Could use his lymphedema pump more regularly.  No skin threat at this time.  Weight loss, exercise, elevation continue to be of benefit.  Return in 1 year.    , MD  04/03/2021 10:43 AM    This note was created with Dragon medical transcription system.  Any errors from dictation are purely unintentional

## 2021-04-03 NOTE — Assessment & Plan Note (Signed)
Symptom control is reasonably good with compression socks and elevation.  Could use his lymphedema pump more regularly.  No skin threat at this time.  Weight loss, exercise, elevation continue to be of benefit.  Return in 1 year.

## 2021-04-20 ENCOUNTER — Ambulatory Visit: Payer: Self-pay | Admitting: Urology

## 2021-09-27 IMAGING — CT CT ABD-PEL WO/W CM
3 of 12 series · 12 of 46 positions shown, 18 images · IV contrast (omnipaque)
Comparison: None.

CLINICAL DATA: Gross hematuria for 1 month. Dysuria. Right groin
discomfort.

EXAM:
CT ABDOMEN AND PELVIS WITHOUT AND WITH CONTRAST
TECHNIQUE: Multidetector CT imaging of the abdomen and pelvis was performed
following the standard protocol before and following the bolus
administration of intravenous contrast.
CONTRAST:  125mL OMNIPAQUE IOHEXOL 350 MG/ML SOLN

[Series 2: abd without pre 5.00 · axial · non-contrast · 0.93mm/px · z∈[-1499,-1124]mm · 6 of 105 slices shown, 11 images]
[im 15/105  soft-tissue]
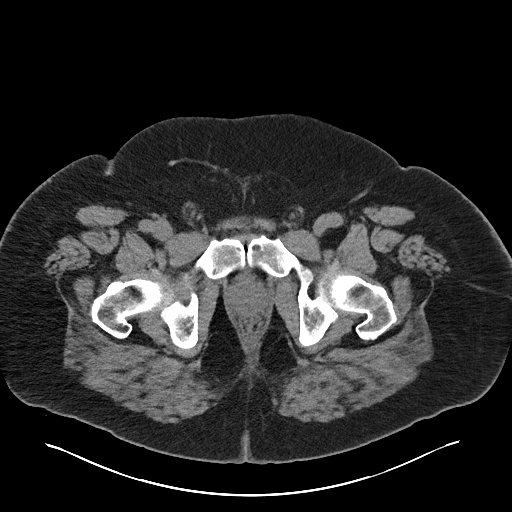
[im 15/105  bone]
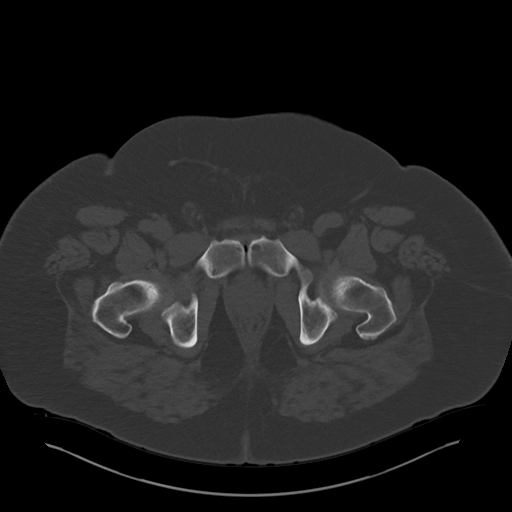
[im 30/105  soft-tissue]
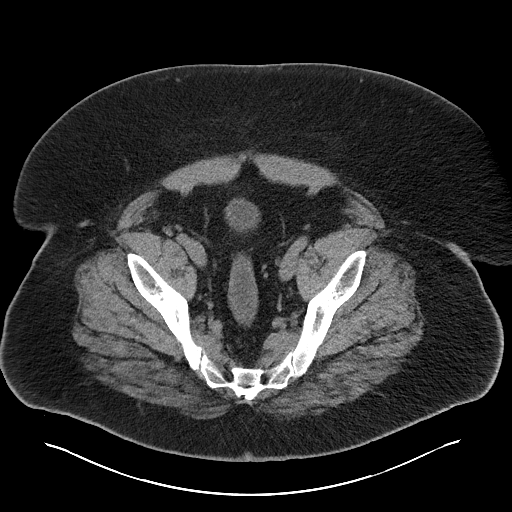
[im 45/105  soft-tissue]
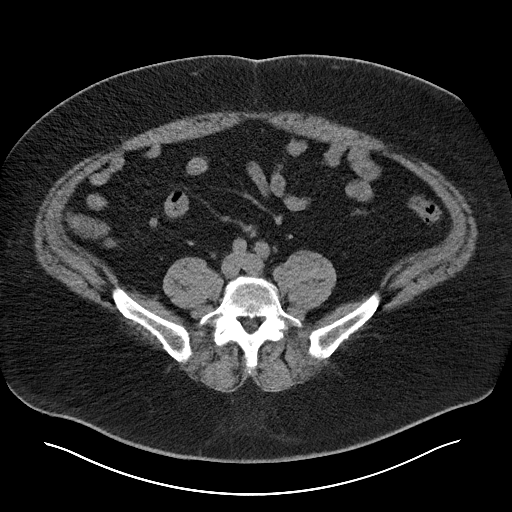
[im 45/105  lung]
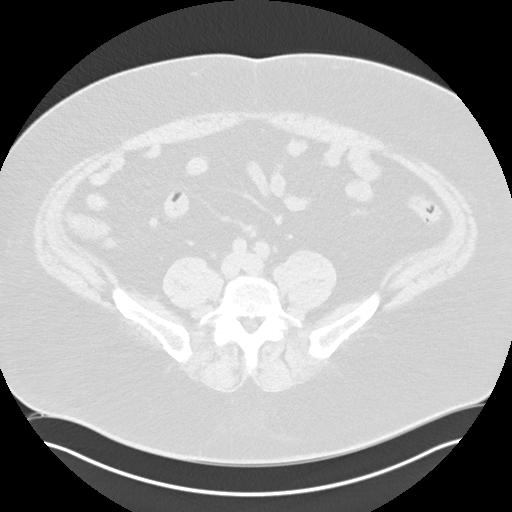
[im 60/105  soft-tissue]
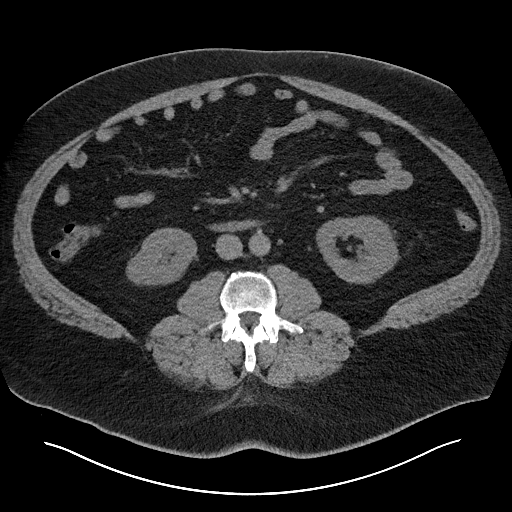
[im 60/105  lung]
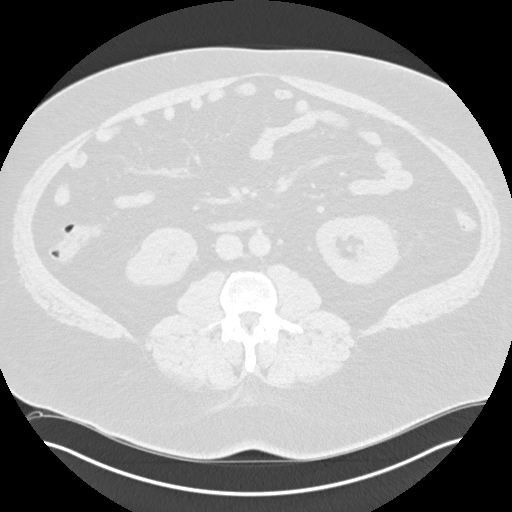
[im 75/105  soft-tissue]
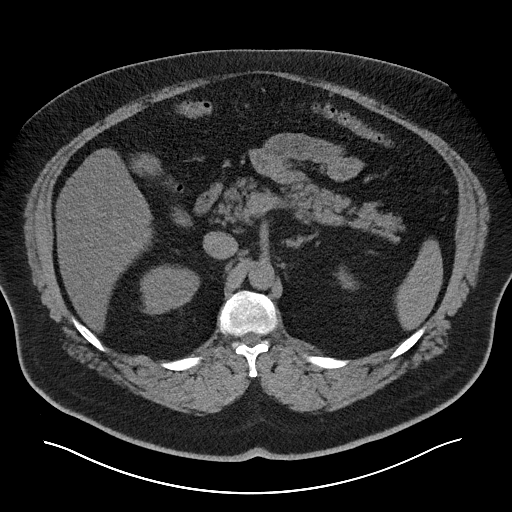
[im 75/105  lung]
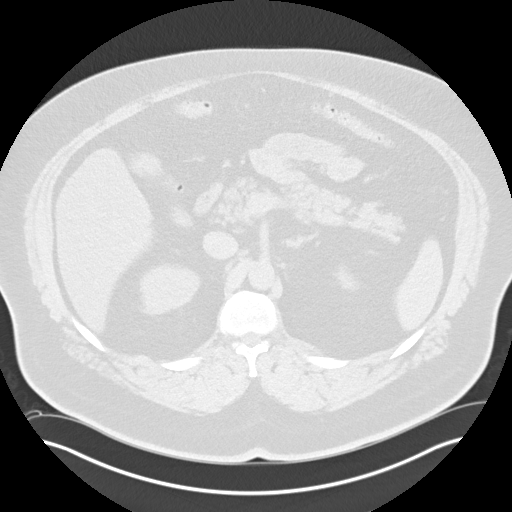
[im 90/105  soft-tissue]
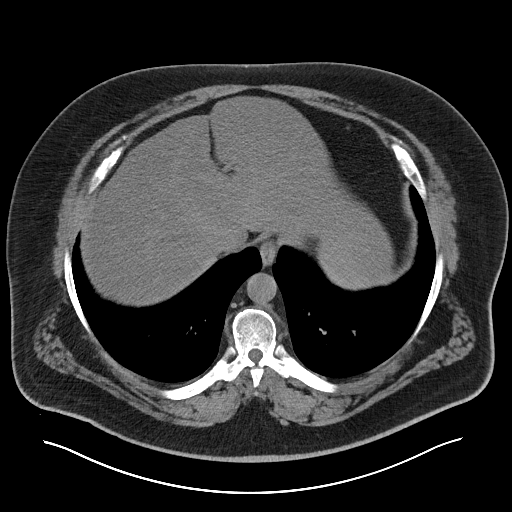
[im 90/105  lung]
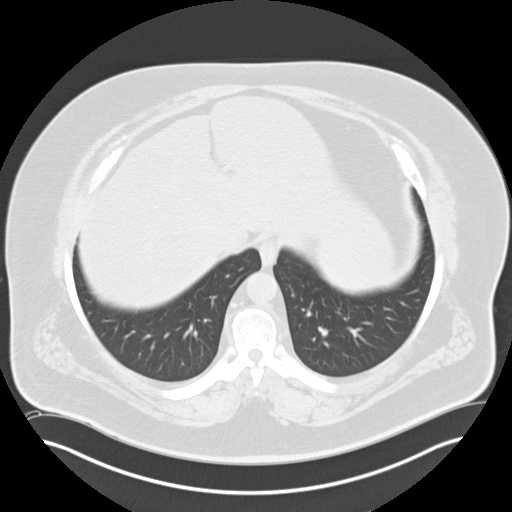

[Series 5: cor without without pre 2.00 cor · coronal · non-contrast · 0.93mm/px · 2 of 198 slices shown, 3 images]
[im 66/198  soft-tissue]
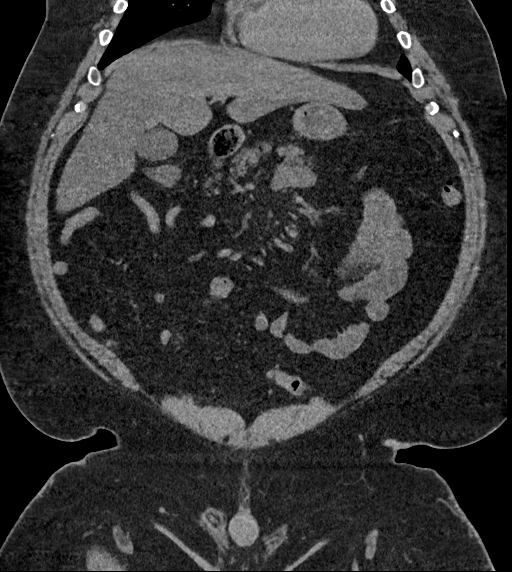
[im 66/198  bone]
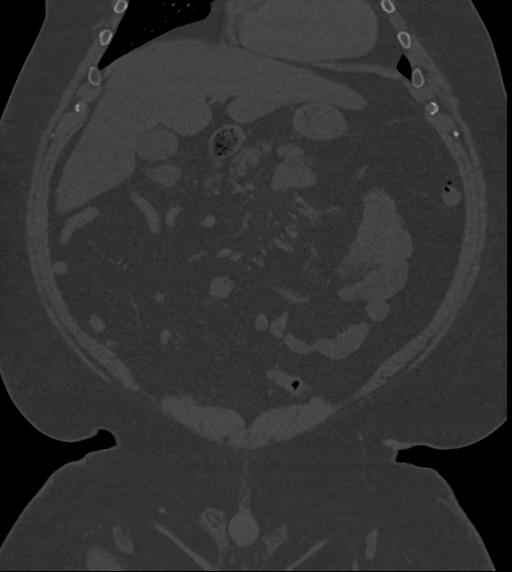
[im 132/198  soft-tissue]
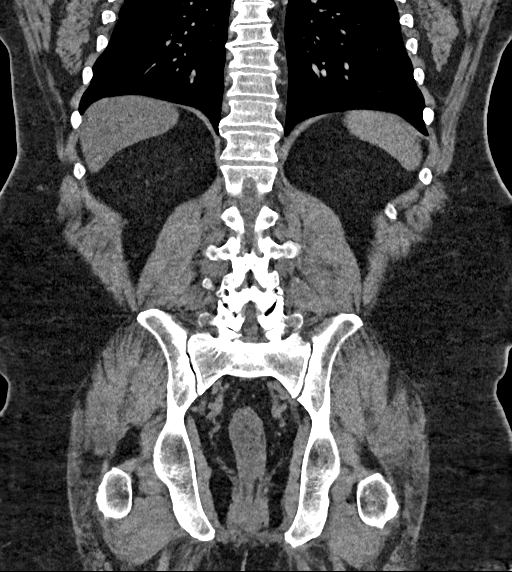

[Series 10: axial with hematuria with 5.00 · axial · 0.93mm/px · z∈[-1499,-1274]mm · 4 of 105 slices shown]
[im 15/105  soft-tissue]
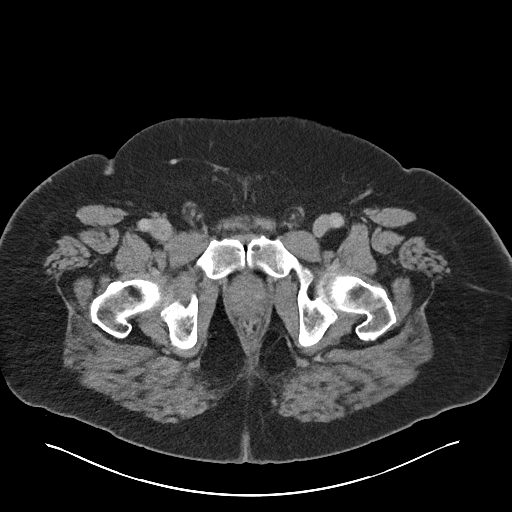
[im 30/105  soft-tissue]
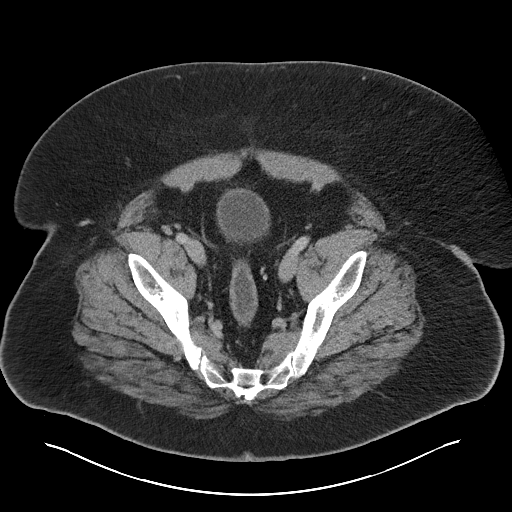
[im 45/105  soft-tissue]
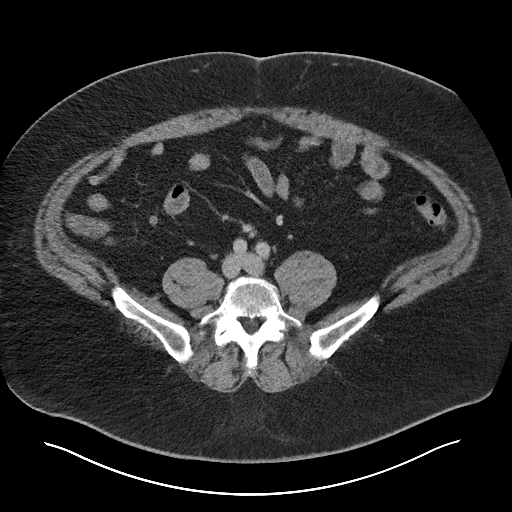
[im 60/105  soft-tissue]
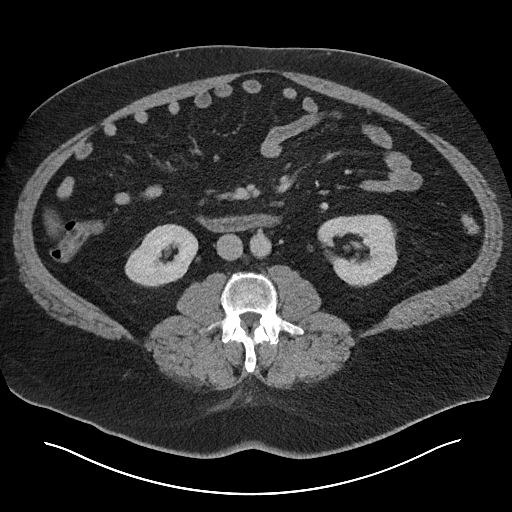

[12 of 46 positions shown; findings below may reference images not displayed]

FINDINGS: Lower chest: Clear lung bases. Borderline cardiomegaly, without
pericardial or pleural effusion.

Hepatobiliary: Moderate hepatic steatosis, without focal liver
lesion. Normal gallbladder, without biliary ductal dilatation.

Pancreas: Fatty replacement involving the pancreatic head and
uncinate process. No duct dilatation or acute inflammation.

Spleen: Normal in size, without focal abnormality.

Adrenals/Urinary Tract: Normal adrenal glands. No renal calculi or
hydronephrosis. No hydroureter or ureteric calculi. No bladder
calculi.

No renal mass on post-contrast imaging. Moderate renal collecting
system opacification on delayed images. Good ureteric opacification,
without filling defect. No enhancing bladder mass or filling defect
on delayed images.

Stomach/Bowel: Normal stomach, without wall thickening. Scattered
colonic diverticula. Normal terminal ileum and appendix. Normal
small bowel.

Vascular/Lymphatic: Aortic atherosclerosis. No abdominopelvic
adenopathy.

Reproductive: Normal prostate.

Other: No significant free fluid. Tiny fat containing periumbilical
ventral wall hernia or laxity.

Musculoskeletal: No acute osseous abnormality.
IMPRESSION: 1. No acute process or explanation for hematuria.
2. Hepatic steatosis.
3. Aortic Atherosclerosis ([id number]).

## 2022-01-26 ENCOUNTER — Encounter: Payer: Self-pay | Admitting: Emergency Medicine

## 2022-01-26 ENCOUNTER — Ambulatory Visit
Admission: EM | Admit: 2022-01-26 | Discharge: 2022-01-26 | Disposition: A | Discharge status: Home / Self Care | Payer: BC Managed Care – PPO | Attending: Physician Assistant | Admitting: Physician Assistant

## 2022-01-26 ENCOUNTER — Other Ambulatory Visit: Payer: Self-pay

## 2022-01-26 ENCOUNTER — Ambulatory Visit (INDEPENDENT_AMBULATORY_CARE_PROVIDER_SITE_OTHER): Payer: BC Managed Care – PPO

## 2022-01-26 DIAGNOSIS — M545 Low back pain, unspecified: Secondary | ICD-10-CM | POA: Diagnosis not present

## 2022-01-26 DIAGNOSIS — M546 Pain in thoracic spine: Secondary | ICD-10-CM

## 2022-01-26 DIAGNOSIS — R0781 Pleurodynia: Secondary | ICD-10-CM

## 2022-01-26 MED ORDER — HYDROCODONE-ACETAMINOPHEN 7.5-325 MG PO TABS: 1.0000 | ORAL_TABLET | Freq: Three times a day (TID) | ORAL | 0 refills | Status: AC | PRN

## 2022-01-26 MED ORDER — BACLOFEN 10 MG PO TABS: 10.0000 mg | ORAL_TABLET | Freq: Three times a day (TID) | ORAL | 0 refills | Status: DC | PRN

## 2022-01-26 MED ORDER — METHYLPREDNISOLONE 4 MG PO TBPK: ORAL_TABLET | ORAL | 0 refills | Status: DC

## 2022-01-26 NOTE — ED Triage Notes (Signed)
Patient reports left side lower back pain that radiates towards his spine.  Patient states that this pain has been going on for a week.  Patient states that the area is tender to the touch.  Patient states that pain has gotten worse on last Sunday.  Patient reports he did have a injury while at work over a week ago but had injured his right knee.  Patient reports taking otc ibuprofen and tylenol with no relief.  Patient states that when he turned over in bed last night he felt a pop on the left side of his lower back.  Patient states that he was immediately in pain and felt nauseous.  ?

## 2022-01-26 NOTE — Discharge Instructions (Signed)
BACK PAIN: Stressed avoiding painful activities . RICE (REST, ICE, COMPRESSION, ELEVATION) guidelines reviewed. May alternate ice and heat. Consider use of muscle rubs, Salonpas patches, etc. Use medications as directed including muscle relaxers if prescribed. Take anti-inflammatory medications as prescribed or OTC NSAIDs/Tylenol.  F/u with PCP in 7-10 days for reexamination, and please feel free to call or return to the urgent care at any time for any questions or concerns you may have and we will be happy to help you!  ° °BACK PAIN RED FLAGS: If the back pain acutely worsens or there are any red flag symptoms such as numbness/tingling, leg weakness, saddle anesthesia, or loss of bowel/bladder control, go immediately to the ER. Follow up with us as scheduled or sooner if the pain does not begin to resolve or if it worsens before the follow up   ° °You may have a condition requiring you to follow up with Orthopedics so please call one of the following office for appointment:  ° °[facility] °[address], [place], [place] [address] °Phone: [phone] ° °[facility] °[address], [place], [place] [address] °Phone: [phone]  °

## 2022-01-26 NOTE — ED Provider Notes (Signed)
?[facility] ? ? ? ?CSN: [id number] ?Arrival date & time: 01/26/22  1100 ? ? ?  ? ?History   ?Chief Complaint ?Chief Complaint  ?Patient presents with  ? Back Pain  ? ? ?HPI ?Stanley Fernandez is a 51 y.o. male presenting for left thoracolumbar back pain for the past week after a fall into a hole.  Patient says he had sudden and severe acute worsening of symptoms when he rolled over in bed last night.  He has been taking over-the-counter NSAIDs and Tylenol and also applying heat and ice to his back.  Says it has not really helped.  Patient reports he injured his knee when he fell a week ago and has been using a crutch and he wonders if his current back pain is related to the way he is using his crutch or favoring the opposite side.  He is denying any radiation of pain to the lower extremity, numbness, weakness or tingling.  He does report some increased pain when he sits or stands for long period of time.  Patient says however when he is up and moving around seems to go a little better.  Increased pain when he takes a deep breath.  Does not feel short of breath and has no chest pain.  No history of any back issues.  No other complaints. ? ?HPI ? ?Past Medical History:  ?Diagnosis Date  ? allergic rhinitis   ? managed with antihistamine  ? Bright's disease   ? Chicken pox   ? Hypertension   ? Obesity (BMI 30-39.9)   ? Sleep apnea, obstructive   ? last study 2009  ? Traumatic hematoma of right lower leg 04/08/2017  ? Repeat aspiration October 03, 1998 1970 cc Repeat aspiration Feb 13, 1999 1970 cc  ? ? ?Patient Active Problem List  ? Diagnosis Date Noted  ? History of gross hematuria 04/11/2020  ? Lymphedema 09/28/2019  ? Encounter for preventive health examination 09/13/2019  ? Prediabetes 02/11/2018  ? Shortness of breath 02/03/2018  ? Vitamin D deficiency 06/20/2015  ? Degenerative arthritis of right knee 01/25/2014  ? Meniscus degeneration 01/25/2014  ? Dyspnea 09/28/2013  ? Allergic rhinitis 01/23/2013   ? Chronic right shoulder pain 05/25/2012  ? Sleep apnea, obstructive   ? Morbid obesity (HCC)   ? ? ?Past Surgical History:  ?Procedure Laterality Date  ? FINGER NAIL SURGERY    ? MEDIAL COLLATERAL LIGAMENT AND LATERAL COLLATERAL LIGAMENT REPAIR, KNEE  2007  ? TONSILLECTOMY AND ADENOIDECTOMY  2004  ? ? ? ? ? ?Home Medications   ? ?Prior to Admission medications   ?Medication Sig Start Date End Date Taking? Authorizing Provider  ?acetaminophen (TYLENOL) 500 MG tablet Take 1,000 mg by mouth every 6 (six) hours as needed.   Yes [provider]  ?baclofen (LIORESAL) 10 MG tablet Take 1 tablet (10 mg total) by mouth 3 (three) times daily as needed for muscle spasms. 01/26/22  Yes Shirlee Latch, PA-C  ?HYDROcodone-acetaminophen (NORCO) 7.5-325 MG tablet Take 1 tablet by mouth 3 (three) times daily as needed for up to 5 days for moderate pain. 01/26/22 01/31/22 Yes Eusebio Friendly B, PA-C  ?ibuprofen (ADVIL) 600 MG tablet Take 1 tablet (600 mg total) by mouth every 6 (six) hours as needed. 05/16/20  Yes Domenick Gong, MD  ?methylPREDNISolone (MEDROL DOSEPAK) 4 MG TBPK tablet Take according to Dosepak instructions 01/26/22  Yes Shirlee Latch, PA-C  ?benzonatate (TESSALON) 200 MG capsule Take 1 capsule (  200 mg total) by mouth 3 (three) times daily as needed for cough. ?Patient not taking: Reported on 04/03/2021 05/16/20   Domenick Gong, MD  ?cholecalciferol (VITAMIN D3) 25 MCG (1000 UT) tablet Take 1,000 Units by mouth daily.    [provider]  ?fluticasone (FLONASE) 50 MCG/ACT nasal spray Place 2 sprays into both nostrils daily. 05/16/20   Domenick Gong, MD  ?Omega-3 Fatty Acids (FISH OIL) 600 MG CAPS Take 1 capsule by mouth daily as needed.    [provider]  ?PRESCRIPTION MEDICATION Apply 1 application topically daily as needed. Shin ?Patient not taking: Reported on 04/03/2021    [provider]  ?cetirizine (ZYRTEC) 10 MG tablet Take 10 mg by mouth daily.  05/16/20  [provider]  ? ? ?Family History ?Family History  ?Problem Relation Age of Onset  ? Cancer Mother 37  ?     breast, mets to brain   ? Heart disease Father 15  ?     AMI  ? Heart attack Father   ? Colon cancer Maternal Grandfather   ? ? ?Social History ?Social History  ? ?Tobacco Use  ? Smoking status: Never  ? Smokeless tobacco: Former  ?  Quit date: 05/25/2002  ?Vaping Use  ? Vaping Use: Never used  ?Substance Use Topics  ? Alcohol use: Yes  ?  Comment: rare  ? Drug use: No  ? ? ? ?Allergies   ?Patient has no known allergies. ? ? ?Review of Systems ?Review of Systems  ?Constitutional:  Negative for fatigue and fever.  ?Respiratory:  Negative for cough and shortness of breath.   ?Cardiovascular:  Negative for chest pain.  ?Gastrointestinal:  Negative for abdominal pain.  ?Genitourinary:  Negative for dysuria and hematuria.  ?Musculoskeletal:  Positive for arthralgias (right knee) and back pain. Negative for gait problem.  ?Skin:  Negative for rash.  ?Neurological:  Negative for weakness and numbness.  ? ? ?Physical Exam ?Triage Vital Signs ?ED Triage Vitals  ?Enc Vitals Group  ?   BP 01/26/22 1142 (!) 151/86  ?   Pulse Rate 01/26/22 1142 (!) 57  ?   Resp 01/26/22 1142 15  ?   Temp 01/26/22 1142 98.1 ?F (36.7 ?C)  ?   Temp Source 01/26/22 1142 Oral  ?   SpO2 01/26/22 1142 98 %  ?   Weight 01/26/22 1140 (!) 348 lb 15.8 oz (158.3 kg)  ?   Height 01/26/22 1140 5\' 9"  (1.753 m)  ?   Head Circumference --   ?   Peak Flow --   ?   Pain Score 01/26/22 1140 6  ?   Pain Loc --   ?   Pain Edu? --   ?   Excl. in GC? --   ? ?No data found. ? ?Updated Vital Signs ?BP (!) 151/86 (BP Location: Left Arm)   Pulse (!) 57   Temp 98.1 ?F (36.7 ?C) (Oral)   Resp 15   Ht 5\' 9"  (1.753 m)   Wt (!) 348 lb 15.8 oz (158.3 kg)   SpO2 98%   BMI 51.54 kg/m?  ? ? ?Physical Exam ?Vitals and nursing note reviewed.  ?Constitutional:   ?   General: He is not in acute distress. ?   Appearance: Normal appearance. He is well-developed. He is  obese.  ?HENT:  ?   Head: Normocephalic and atraumatic.  ?Eyes:  ?   General: No scleral icterus. ?   Conjunctiva/sclera: Conjunctivae normal.  ?  Cardiovascular:  ?   Rate and Rhythm: Normal rate and regular rhythm.  ?   Heart sounds: No murmur heard. ?Pulmonary:  ?   Effort: Pulmonary effort is normal. No respiratory distress.  ?   Breath sounds: Normal breath sounds.  ?Abdominal:  ?   Palpations: Abdomen is soft.  ?   Tenderness: There is no abdominal tenderness. There is no right CVA tenderness or left CVA tenderness.  ?Musculoskeletal:  ?   Cervical back: Neck supple.  ?   Comments: Back: There is tenderness to palpation of the left thoracolumbar region and along the left posterior ribs.  Tenderness palpation of the lower thoracic and upper lumbar paravertebral muscles.  Reduced range of motion of back.  Increased pain when rotating to the right.  Increased pain with straight leg raise on the left side but no radiation of pain down the leg.  Normal strength of lower extremities.  ?Skin: ?   General: Skin is warm and dry.  ?   Capillary Refill: Capillary refill takes less than 2 seconds.  ?Neurological:  ?   General: No focal deficit present.  ?   Mental Status: He is alert. Mental status is at baseline.  ?   Motor: No weakness.  ?   Coordination: Coordination normal.  ?   Gait: Gait abnormal (using a crutch).  ?Psychiatric:     ?   Mood and Affect: Mood normal.     ?   Behavior: Behavior normal.     ?   Thought Content: Thought content normal.  ? ? ? ?UC Treatments / Results  ?Labs ?(all labs ordered are listed, but only abnormal results are displayed) ?Labs Reviewed - No data to display ? ?EKG ? ? ?Radiology ?DG Ribs Unilateral W/Chest Left ? ?Result Date: 01/26/2022 ?CLINICAL DATA:  Pt c/o posterior left sided lower rib pain; sob when he touches area of pain; fell in a hole 1 week ago. twisted in bed and felt sharp pain left ribs/posteriorly today, pleuritic pain EXAM: LEFT RIBS AND CHEST - 3+ VIEW COMPARISON:   02/03/2018 FINDINGS: No fracture or other bone lesions are seen involving the ribs. There is no evidence of pneumothorax or pleural effusion. Both lungs are clear. Heart size and mediastinal contours are within no

## 2022-04-02 ENCOUNTER — Ambulatory Visit (INDEPENDENT_AMBULATORY_CARE_PROVIDER_SITE_OTHER): Payer: BC Managed Care – PPO | Admitting: Vascular Surgery

## 2022-07-15 ENCOUNTER — Encounter (INDEPENDENT_AMBULATORY_CARE_PROVIDER_SITE_OTHER): Payer: Self-pay

## 2022-08-28 NOTE — Telephone Encounter (Signed)
MyChart messgae sent to patient. 

## 2023-05-01 ENCOUNTER — Encounter (INDEPENDENT_AMBULATORY_CARE_PROVIDER_SITE_OTHER): Payer: Self-pay

## 2023-06-02 ENCOUNTER — Ambulatory Visit: Payer: BC Managed Care – PPO | Admitting: Internal Medicine

## 2023-06-02 ENCOUNTER — Encounter: Payer: Self-pay | Admitting: Internal Medicine

## 2023-06-02 DIAGNOSIS — Z6841 Body Mass Index (BMI) 40.0 and over, adult: Secondary | ICD-10-CM

## 2023-06-02 DIAGNOSIS — R001 Bradycardia, unspecified: Secondary | ICD-10-CM

## 2023-06-02 DIAGNOSIS — R002 Palpitations: Secondary | ICD-10-CM

## 2023-06-02 DIAGNOSIS — Z125 Encounter for screening for malignant neoplasm of prostate: Secondary | ICD-10-CM | POA: Diagnosis not present

## 2023-06-02 DIAGNOSIS — Z1211 Encounter for screening for malignant neoplasm of colon: Secondary | ICD-10-CM

## 2023-06-02 DIAGNOSIS — R7303 Prediabetes: Secondary | ICD-10-CM

## 2023-06-02 DIAGNOSIS — E785 Hyperlipidemia, unspecified: Secondary | ICD-10-CM | POA: Diagnosis not present

## 2023-06-02 DIAGNOSIS — G4733 Obstructive sleep apnea (adult) (pediatric): Secondary | ICD-10-CM

## 2023-06-02 NOTE — Progress Notes (Unsigned)
Subjective:  Patient ID: Stanley Fernandez, male    DOB: [birth year]  Age: 52 y.o. MRN: [id number]  CC: The primary encounter diagnosis was Morbid obesity (HCC). Diagnoses of Prediabetes, Dyslipidemia, Prostate cancer screening, and Colon cancer screening were also pertinent to this visit.   HPI Stanley Fernandez presents for  Chief Complaint  Patient presents with   Medical Management of Chronic Issues    LAST SEEN JANUARY 2022    WEIGHT GAIN OF 30 LB S THIS SUMMER  EATING LOW GLYCEMIC INDEX DIET.  AVOIDS STARCHES EXCEPT OCCASIONAL POTATO CHIPS  1-2 TIMES PER WEEK.  ICE CREAM ONCE A WEEK .  EATING 2 TIMES DAILY BETWEEN 11 AM AND 7 PM   DENIES EXCESSIVE URINATION,  EXCESSIVE THIRST.  NO VOIDS PER NIGHT  UNEMPLOYED ,  going through a separation from wife:  wakes up stressed a lot   Palpitations while lying down but not at sleep      Outpatient Medications Prior to Visit  Medication Sig Dispense Refill   acetaminophen (TYLENOL) 500 MG tablet Take 1,000 mg by mouth every 6 (six) hours as needed. (Patient not taking: Reported on 06/02/2023)     baclofen (LIORESAL) 10 MG tablet Take 1 tablet (10 mg total) by mouth 3 (three) times daily as needed for muscle spasms. (Patient not taking: Reported on 06/02/2023) 30 each 0   benzonatate (TESSALON) 200 MG capsule Take 1 capsule (200 mg total) by mouth 3 (three) times daily as needed for cough. (Patient not taking: Reported on 04/03/2021) 30 capsule 0   cholecalciferol (VITAMIN D3) 25 MCG (1000 UT) tablet Take 1,000 Units by mouth daily.     fluticasone (FLONASE) 50 MCG/ACT nasal spray Place 2 sprays into both nostrils daily. 16 g 0   ibuprofen (ADVIL) 600 MG tablet Take 1 tablet (600 mg total) by mouth every 6 (six) hours as needed. 30 tablet 0   methylPREDNISolone (MEDROL DOSEPAK) 4 MG TBPK tablet Take according to Dosepak instructions 21 tablet 0   Omega-3 Fatty Acids (FISH OIL) 600 MG CAPS Take 1 capsule by mouth daily as needed.      PRESCRIPTION MEDICATION Apply 1 application topically daily as needed. Evette Cristal (Patient not taking: Reported on 04/03/2021)     No facility-administered medications prior to visit.    Review of Systems;  Patient denies headache, fevers, malaise, unintentional weight loss, skin rash, eye pain, sinus congestion and sinus pain, sore throat, dysphagia,  hemoptysis , cough, dyspnea, wheezing, chest pain, palpitations, orthopnea, edema, abdominal pain, nausea, melena, diarrhea, constipation, flank pain, dysuria, hematuria, urinary  Frequency, nocturia, numbness, tingling, seizures,  Focal weakness, Loss of consciousness,  Tremor, insomnia, depression, anxiety, and suicidal ideation.      Objective:  BP 136/80   Pulse (!) 52   Ht 5\' 9"  (1.753 m)   Wt 294 lb 6.4 oz (133.5 kg)   SpO2 98%   BMI 43.48 kg/m   BP Readings from Last 3 Encounters:  06/02/23 136/80  01/26/22 (!) 151/86  04/03/21 (!) 151/88    Wt Readings from Last 3 Encounters:  06/02/23 294 lb 6.4 oz (133.5 kg)  01/26/22 (!) 348 lb 15.8 oz (158.3 kg)  04/03/21 (!) 349 lb (158.3 kg)    Physical Exam  Lab Results  Component Value Date   HGBA1C 6.1 09/22/2019   HGBA1C 5.9 02/10/2018   HGBA1C 6.1 01/21/2013    Lab Results  Component Value Date   CREATININE 0.86 03/21/2020   CREATININE  0.87 09/22/2019   CREATININE 0.93 02/10/2018    Lab Results  Component Value Date   WBC 6.9 03/21/2020   HGB 15.0 03/21/2020   HCT 44.7 03/21/2020   PLT 244.0 03/21/2020   GLUCOSE 95 03/21/2020   CHOL 173 09/22/2019   TRIG 125.0 09/22/2019   HDL 43.90 09/22/2019   LDLDIRECT 93.0 02/10/2018   LDLCALC 104 (H) 09/22/2019   ALT 41 03/21/2020   AST 25 03/21/2020   NA 137 03/21/2020   K 4.1 03/21/2020   CL 102 03/21/2020   CREATININE 0.86 03/21/2020   BUN 12 03/21/2020   CO2 25 03/21/2020   TSH 1.54 09/22/2019   PSA 0.40 03/21/2020   HGBA1C 6.1 09/22/2019   MICROALBUR 2.0 (H) 09/22/2019    DG Ribs Unilateral W/Chest  Left  Result Date: 01/26/2022 CLINICAL DATA:  Pt c/o posterior left sided lower rib pain; sob when he touches area of pain; fell in a hole 1 week ago. twisted in bed and felt sharp pain left ribs/posteriorly today, pleuritic pain EXAM: LEFT RIBS AND CHEST - 3+ VIEW COMPARISON:  02/03/2018 FINDINGS: No fracture or other bone lesions are seen involving the ribs. There is no evidence of pneumothorax or pleural effusion. Both lungs are clear. Heart size and mediastinal contours are within normal limits. IMPRESSION: Negative. Electronically Signed   By: Amie Portland M.D.   On: 01/26/2022 13:07    Assessment & Plan:  .Morbid obesity (HCC) -     TSH -     CBC with Differential/Platelet -     Microalbumin / creatinine urine ratio  Prediabetes -     Comprehensive metabolic panel -     Hemoglobin A1c  Dyslipidemia -     Lipid panel -     LDL cholesterol, direct  Prostate cancer screening -     PSA  Colon cancer screening -     Cologuard     I provided 30 minutes of face-to-face time during this encounter reviewing patient's last visit with me, patient's  most recent visit with cardiology,  nephrology,  and neurology,  recent surgical and non surgical procedures, previous  labs and imaging studies, counseling on currently addressed issues,  and post visit ordering to diagnostics and therapeutics .   Follow-up: No follow-ups on file.   Sherlene Shams, MD

## 2023-06-02 NOTE — Assessment & Plan Note (Signed)
HOME BS readings 90 to 120 as of one month ago.

## 2023-06-02 NOTE — Assessment & Plan Note (Signed)
Diagnosis confirmed with recent sleep study.  OVER TEN YEARS AGO.    Patient is using CPAP every night a minimum of 6 hours per night and has been noting  improved daytime wakefulness and decreased fatigue

## 2023-06-02 NOTE — Patient Instructions (Addendum)
  You can  TAKE up to 2000 mg of acetominophen (tylenol) every day safely  In divided doses (500 mg every 6 hours  Or 1000 mg every 12 hours.)    Fasting sugars should be 80 to 120.  2 hour post prandial sugars should be 160 or less   For blood pressure,  home machines from [employer] are the best    Staring amlodipine 2. 5 mg daily .  Goal BP is 120/70 to  to 130/80  Consider going forward with the 14 day heart monitor to capture the events that are occurring in the morning and rule out paroxysmal atrial fibrillation which can increase your risk of stroke

## 2023-06-03 ENCOUNTER — Ambulatory Visit: Payer: BC Managed Care – PPO | Attending: Internal Medicine

## 2023-06-03 DIAGNOSIS — R002 Palpitations: Secondary | ICD-10-CM | POA: Diagnosis not present

## 2023-06-03 LAB — MICROALBUMIN / CREATININE URINE RATIO
Creatinine,U: 68.8 mg/dL
Microalb Creat Ratio: 1.1 mg/g (ref 0.0–30.0)
Microalb, Ur: 0.7 mg/dL (ref 0.0–1.9)

## 2023-06-03 LAB — CBC WITH DIFFERENTIAL/PLATELET
Basophils Absolute: 0.1 10*3/uL (ref 0.0–0.1)
Basophils Relative: 0.9 % (ref 0.0–3.0)
Eosinophils Absolute: 0.2 10*3/uL (ref 0.0–0.7)
Eosinophils Relative: 2.6 % (ref 0.0–5.0)
HCT: 44.9 % (ref 39.0–52.0)
Hemoglobin: 14.5 g/dL (ref 13.0–17.0)
Lymphocytes Relative: 27.7 % (ref 12.0–46.0)
Lymphs Abs: 2.3 10*3/uL (ref 0.7–4.0)
MCHC: 32.3 g/dL (ref 30.0–36.0)
MCV: 91.2 fl (ref 78.0–100.0)
Monocytes Absolute: 0.6 10*3/uL (ref 0.1–1.0)
Monocytes Relative: 7.2 % (ref 3.0–12.0)
Neutro Abs: 5 10*3/uL (ref 1.4–7.7)
Neutrophils Relative %: 61.6 % (ref 43.0–77.0)
Platelets: 290 10*3/uL (ref 150.0–400.0)
RBC: 4.93 Mil/uL (ref 4.22–5.81)
RDW: 13 % (ref 11.5–15.5)
WBC: 8.1 10*3/uL (ref 4.0–10.5)

## 2023-06-03 LAB — COMPREHENSIVE METABOLIC PANEL
ALT: 14 U/L (ref 0–53)
AST: 14 U/L (ref 0–37)
Albumin: 4.1 g/dL (ref 3.5–5.2)
Alkaline Phosphatase: 63 U/L (ref 39–117)
BUN: 13 mg/dL (ref 6–23)
CO2: 27 meq/L (ref 19–32)
Calcium: 9 mg/dL (ref 8.4–10.5)
Chloride: 103 meq/L (ref 96–112)
Creatinine, Ser: 0.78 mg/dL (ref 0.40–1.50)
GFR: 101.92 mL/min (ref >60.00)
Glucose, Bld: 79 mg/dL (ref 70–99)
Potassium: 4.2 meq/L (ref 3.5–5.1)
Sodium: 138 meq/L (ref 135–145)
Total Bilirubin: 0.8 mg/dL (ref 0.2–1.2)
Total Protein: 6.5 g/dL (ref 6.0–8.3)

## 2023-06-03 LAB — HEMOGLOBIN A1C: Hgb A1c MFr Bld: 5.2 % (ref 4.6–6.5)

## 2023-06-03 LAB — LIPID PANEL
Cholesterol: 161 mg/dL (ref 0–200)
HDL: 58.5 mg/dL (ref >39.00)
LDL Cholesterol: 74 mg/dL (ref 0–99)
NonHDL: 102.51
Total CHOL/HDL Ratio: 3
Triglycerides: 141 mg/dL (ref 0.0–149.0)
VLDL: 28.2 mg/dL (ref 0.0–40.0)

## 2023-06-03 LAB — TSH: TSH: 1.93 u[IU]/mL (ref 0.35–5.50)

## 2023-06-03 LAB — PSA: PSA: 0.51 ng/mL (ref 0.10–4.00)

## 2023-06-03 LAB — LDL CHOLESTEROL, DIRECT: Direct LDL: 93 mg/dL

## 2023-06-03 NOTE — Addendum Note (Signed)
Addended by: Sherlene Shams on: 06/03/2023 07:59 AM   Modules accepted: Orders

## 2023-06-03 NOTE — Assessment & Plan Note (Signed)
I have ordered and reviewed a 12 lead EKG and find that there are no acute changes and patient is in sinus  bradycardia .  ZIO monitor ordered.  If the episodes are confirmed and appear overnight,  will need repeat sleep study as he reports compliance with CPAP use

## 2023-06-03 NOTE — Assessment & Plan Note (Signed)
I have  encouraged  him to resume exercising and following  low glycemic index diet and regular exercise a minimum of 5 days per week.   Screening labs for diabetes,  hypothyroidism ordered

## 2023-06-04 ENCOUNTER — Telehealth: Payer: Self-pay | Admitting: Internal Medicine

## 2023-06-04 MED ORDER — AMLODIPINE BESYLATE 2.5 MG PO TABS: 2.5000 mg | ORAL_TABLET | Freq: Every day | ORAL | 1 refills | Status: DC

## 2023-06-04 NOTE — Telephone Encounter (Signed)
Per AVS from last office visit I have sent in Amlodipine 2.5 mg.

## 2023-06-04 NOTE — Addendum Note (Signed)
Addended by: Sandy Salaam on: 06/04/2023 01:09 PM   Modules accepted: Orders

## 2023-06-04 NOTE — Telephone Encounter (Signed)
Patient called about not having his amlodipine  refilled. He said Dr Darrick Huntsman wanted him to start taking. His pharmacy is [facility], in [place]

## 2023-09-01 ENCOUNTER — Ambulatory Visit: Payer: BC Managed Care – PPO | Admitting: Internal Medicine

## 2023-10-02 ENCOUNTER — Ambulatory Visit: Payer: BC Managed Care – PPO | Admitting: Internal Medicine

## 2023-11-11 ENCOUNTER — Ambulatory Visit: Payer: BC Managed Care – PPO | Admitting: Internal Medicine

## 2024-01-05 ENCOUNTER — Ambulatory Visit: Payer: BC Managed Care – PPO | Admitting: Internal Medicine

## 2024-02-03 ENCOUNTER — Encounter: Payer: Self-pay | Admitting: Internal Medicine

## 2024-02-03 ENCOUNTER — Ambulatory Visit: Admitting: Internal Medicine

## 2024-02-03 VITALS — BP 144/84 | HR 65 | Ht 69.0 in | Wt 287.4 lb

## 2024-02-03 DIAGNOSIS — G4733 Obstructive sleep apnea (adult) (pediatric): Secondary | ICD-10-CM

## 2024-02-03 DIAGNOSIS — F339 Major depressive disorder, recurrent, unspecified: Secondary | ICD-10-CM

## 2024-02-03 DIAGNOSIS — I890 Lymphedema, not elsewhere classified: Secondary | ICD-10-CM

## 2024-02-03 DIAGNOSIS — R7303 Prediabetes: Secondary | ICD-10-CM

## 2024-02-03 DIAGNOSIS — R002 Palpitations: Secondary | ICD-10-CM | POA: Diagnosis not present

## 2024-02-03 DIAGNOSIS — Z125 Encounter for screening for malignant neoplasm of prostate: Secondary | ICD-10-CM

## 2024-02-03 MED ORDER — TRAZODONE HCL 50 MG PO TABS: 25.0000 mg | ORAL_TABLET | Freq: Every evening | ORAL | 3 refills | Status: DC | PRN

## 2024-02-03 NOTE — Assessment & Plan Note (Signed)
Diagnosed by sleep study. he is wearing her CPAP every night a minimum of 6 hours per night and notes improved daytime wakefulness and decreased fatigue  

## 2024-02-03 NOTE — Progress Notes (Signed)
 Subjective:  Patient ID: Stanley Fernandez, male    DOB: [birth year]  Age: 53 y.o. MRN: [id number]  CC: The primary encounter diagnosis was Prediabetes. Diagnoses of Sleep apnea, obstructive, Morbid obesity (HCC), Palpitations, Prostate cancer screening, Lymphedema, and Major depressive disorder, recurrent episode with mixed features (HCC) were also pertinent to this visit.   HPI Stanley Fernandez presents for  Chief Complaint  Patient presents with   Medical Management of Chronic Issues    3 month follow up    1) MORBID OBESITY  (bmi >40,  HYPERTENSION,  OSA  DJD RIGHT KNEE ):   started   taking amlodipine   after last visit but stopped medication "because he didn't want to be on medications  because his father was on a lot of medications when he died "  willing to resume for readings> 130/80  2) Depression:  aggravated by financial and  marital matters.  He  denies suicidality and "I'm coming out of it" using prayer and meditation .  Currently working for [employer].  Since he lost his job.   His wife left him  he is unemployed  his father died and he can't see his kids.   2) . OSA:  machine is 58 yrs old.  Using nasal pillows. Wonders if his OSA is adequately treated .  Defers repeat study      Outpatient Medications Prior to Visit  Medication Sig Dispense Refill   amLODipine  (NORVASC ) 2.5 MG tablet Take 1 tablet (2.5 mg total) by mouth daily. (Patient not taking: Reported on 02/03/2024) 90 tablet 1   No facility-administered medications prior to visit.    Review of Systems;  Patient denies headache, fevers, malaise, unintentional weight loss, skin rash, eye pain, sinus congestion and sinus pain, sore throat, dysphagia,  hemoptysis , cough, dyspnea, wheezing, chest pain, palpitations, orthopnea, edema, abdominal pain, nausea, melena, diarrhea, constipation, flank pain, dysuria, hematuria, urinary  Frequency, nocturia, numbness, tingling, seizures,  Focal weakness, Loss  of consciousness,  Tremor, insomnia, depression, anxiety, and suicidal ideation.      Objective:  BP (!) 144/84   Pulse 65   Ht 5\' 9"  (1.753 m)   Wt 287 lb 6.4 oz (130.4 kg)   SpO2 96%   BMI 42.44 kg/m   BP Readings from Last 3 Encounters:  02/03/24 (!) 144/84  06/02/23 136/80  01/26/22 (!) 151/86    Wt Readings from Last 3 Encounters:  02/03/24 287 lb 6.4 oz (130.4 kg)  06/02/23 294 lb 6.4 oz (133.5 kg)  01/26/22 (!) 348 lb 15.8 oz (158.3 kg)    Physical Exam Vitals reviewed.  Constitutional:      General: He is not in acute distress.    Appearance: Normal appearance. He is normal weight. He is not ill-appearing, toxic-appearing or diaphoretic.  HENT:     Head: Normocephalic.  Eyes:     General: No scleral icterus.       Right eye: No discharge.        Left eye: No discharge.     Conjunctiva/sclera: Conjunctivae normal.  Cardiovascular:     Rate and Rhythm: Normal rate and regular rhythm.     Heart sounds: Normal heart sounds.  Pulmonary:     Effort: Pulmonary effort is normal. No respiratory distress.     Breath sounds: Normal breath sounds.  Musculoskeletal:        General: Normal range of motion.     Cervical back: Normal range of motion.  Right lower leg: Edema present.     Left lower leg: Edema present.  Skin:    General: Skin is warm and dry.  Neurological:     General: No focal deficit present.     Mental Status: He is alert and oriented to person, place, and time. Mental status is at baseline.  Psychiatric:        Attention and Perception: Attention and perception normal.        Mood and Affect: Mood is depressed.        Speech: Speech normal.        Behavior: Behavior normal.        Thought Content: Thought content normal.        Judgment: Judgment normal.   Lab Results  Component Value Date   HGBA1C 5.2 06/02/2023   HGBA1C 6.1 09/22/2019   HGBA1C 5.9 02/10/2018    Lab Results  Component Value Date   CREATININE 0.78 06/02/2023    CREATININE 0.86 03/21/2020   CREATININE 0.87 09/22/2019    Lab Results  Component Value Date   WBC 8.1 06/02/2023   HGB 14.5 06/02/2023   HCT 44.9 06/02/2023   PLT 290.0 06/02/2023   GLUCOSE 79 06/02/2023   CHOL 161 06/02/2023   TRIG 141.0 06/02/2023   HDL 58.50 06/02/2023   LDLDIRECT 93.0 06/02/2023   LDLCALC 74 06/02/2023   ALT 14 06/02/2023   AST 14 06/02/2023   NA 138 06/02/2023   K 4.2 06/02/2023   CL 103 06/02/2023   CREATININE 0.78 06/02/2023   BUN 13 06/02/2023   CO2 27 06/02/2023   TSH 1.93 06/02/2023   PSA 0.51 06/02/2023   HGBA1C 5.2 06/02/2023   MICROALBUR 0.7 06/02/2023    DG Ribs Unilateral W/Chest Left Result Date: 01/26/2022 CLINICAL DATA:  Pt c/o posterior left sided lower rib pain; sob when he touches area of pain; fell in a hole 1 week ago. twisted in bed and felt sharp pain left ribs/posteriorly today, pleuritic pain EXAM: LEFT RIBS AND CHEST - 3+ VIEW COMPARISON:  02/03/2018 FINDINGS: No fracture or other bone lesions are seen involving the ribs. There is no evidence of pneumothorax or pleural effusion. Both lungs are clear. Heart size and mediastinal contours are within normal limits. IMPRESSION: Negative. Electronically Signed   By: Amanda Jungling M.D.   On: 01/26/2022 13:07    Assessment & Plan:  .Prediabetes -     Comprehensive metabolic panel with GFR -     Hemoglobin A1c -     Lipid Panel w/reflex Direct LDL; Future -     Hemoglobin A1c; Future  Sleep apnea, obstructive Assessment & Plan: Diagnosed by sleep study. he is wearing her CPAP every night a minimum of 6 hours per night and notes improved daytime wakefulness and decreased fatigue     Morbid obesity (HCC) Assessment & Plan: I have  encouraged  him to resume exercising and following  low glycemic index diet and regular exercise a minimum of 5 days per week.   Screening labs for diabetes,  hypothyroidism ordered     Palpitations Assessment & Plan: No arrhtyhmias per  ZIO monitor  ordered.   Orders: -     Comprehensive metabolic panel with GFR; Future  Prostate cancer screening -     PSA; Future  Lymphedema Assessment & Plan: Encouarged to use pumps more often.   but he has deferred because he is physically active during the day    Major depressive disorder, recurrent episode  with mixed features [facility]) Assessment & Plan: He recalls prior use of wellbutrin over a decade ago but currently declines.  He denies suicidality    Other orders -     traZODone HCl; Take 0.5-1 tablets (25-50 mg total) by mouth at bedtime as needed for sleep.  Dispense: 30 tablet; Refill: 3     I spent 30 minutes on the day of this face to face encounter reviewing patient's  most recent   labs and imaging studies, prior EKG and ZIO monitor, counseling on weight management,  reviewing the assessment and plan with patient, and post visit ordering and reviewing of  diagnostics and therapeutics with patient  .   Follow-up: Return in about 4 months (around 06/05/2024) for physical.   Thersia Flax, MD

## 2024-02-03 NOTE — Patient Instructions (Addendum)
   Please check your blood pressure  ONCE A WEEK at home and send me 4 or 5  readings  in one month so I can determine if you need to start an  medication to lower your blood pressure .   Normal blood pressure is < 130/80   The newer CPAP machines are "autotitrating", meaning that the machine senses and adjusts  the rate of flow to match your needs.    Return for your PCE on or after Sept 16

## 2024-02-04 DIAGNOSIS — F339 Major depressive disorder, recurrent, unspecified: Secondary | ICD-10-CM | POA: Insufficient documentation

## 2024-02-04 NOTE — Assessment & Plan Note (Signed)
 Encouarged to use pumps more often.   but he has deferred because he is physically active during the day

## 2024-02-04 NOTE — Assessment & Plan Note (Signed)
 No arrhtyhmias per  ZIO monitor ordered.

## 2024-02-04 NOTE — Assessment & Plan Note (Signed)
 He recalls prior use of wellbutrin over a decade ago but currently declines.  He denies suicidality

## 2024-02-04 NOTE — Assessment & Plan Note (Signed)
I have  encouraged  him to resume exercising and following  low glycemic index diet and regular exercise a minimum of 5 days per week.   Screening labs for diabetes,  hypothyroidism ordered

## 2024-06-08 ENCOUNTER — Other Ambulatory Visit (INDEPENDENT_AMBULATORY_CARE_PROVIDER_SITE_OTHER)

## 2024-06-08 DIAGNOSIS — Z125 Encounter for screening for malignant neoplasm of prostate: Secondary | ICD-10-CM

## 2024-06-08 DIAGNOSIS — R7303 Prediabetes: Secondary | ICD-10-CM

## 2024-06-08 DIAGNOSIS — R002 Palpitations: Secondary | ICD-10-CM

## 2024-06-08 LAB — PSA: PSA: 0.71 ng/mL (ref 0.10–4.00)

## 2024-06-08 LAB — COMPREHENSIVE METABOLIC PANEL WITH GFR
ALT: 12 U/L (ref 0–53)
AST: 11 U/L (ref 0–37)
Albumin: 4.1 g/dL (ref 3.5–5.2)
Alkaline Phosphatase: 61 U/L (ref 39–117)
BUN: 14 mg/dL (ref 6–23)
CO2: 29 meq/L (ref 19–32)
Calcium: 9.1 mg/dL (ref 8.4–10.5)
Chloride: 106 meq/L (ref 96–112)
Creatinine, Ser: 0.67 mg/dL (ref 0.40–1.50)
GFR: 105.95 mL/min (ref >60.00)
Glucose, Bld: 95 mg/dL (ref 70–99)
Potassium: 4.7 meq/L (ref 3.5–5.1)
Sodium: 140 meq/L (ref 135–145)
Total Bilirubin: 0.7 mg/dL (ref 0.2–1.2)
Total Protein: 6.4 g/dL (ref 6.0–8.3)

## 2024-06-08 LAB — HEMOGLOBIN A1C: Hgb A1c MFr Bld: 5.8 % (ref 4.6–6.5)

## 2024-06-09 LAB — LIPID PANEL W/REFLEX DIRECT LDL
Cholesterol: 128 mg/dL (ref <200)
HDL: 47 mg/dL (ref >=40)
LDL Cholesterol (Calc): 67 mg/dL
Non-HDL Cholesterol (Calc): 81 mg/dL (ref <130)
Total CHOL/HDL Ratio: 2.7 (calc) (ref <5.0)
Triglycerides: 63 mg/dL (ref <150)

## 2024-06-10 ENCOUNTER — Ambulatory Visit: Admitting: Internal Medicine

## 2024-06-10 ENCOUNTER — Encounter: Payer: Self-pay | Admitting: Internal Medicine

## 2024-06-10 VITALS — BP 134/86 | HR 55 | Temp 97.3°F | Ht 69.0 in | Wt 266.8 lb

## 2024-06-10 DIAGNOSIS — Z1211 Encounter for screening for malignant neoplasm of colon: Secondary | ICD-10-CM | POA: Diagnosis not present

## 2024-06-10 DIAGNOSIS — G4733 Obstructive sleep apnea (adult) (pediatric): Secondary | ICD-10-CM

## 2024-06-10 DIAGNOSIS — Z0000 Encounter for general adult medical examination without abnormal findings: Secondary | ICD-10-CM | POA: Diagnosis not present

## 2024-06-10 DIAGNOSIS — I890 Lymphedema, not elsewhere classified: Secondary | ICD-10-CM | POA: Diagnosis not present

## 2024-06-10 DIAGNOSIS — M25562 Pain in left knee: Secondary | ICD-10-CM | POA: Diagnosis not present

## 2024-06-10 DIAGNOSIS — I10 Essential (primary) hypertension: Secondary | ICD-10-CM | POA: Diagnosis not present

## 2024-06-10 DIAGNOSIS — G4709 Other insomnia: Secondary | ICD-10-CM

## 2024-06-10 DIAGNOSIS — E1159 Type 2 diabetes mellitus with other circulatory complications: Secondary | ICD-10-CM

## 2024-06-10 DIAGNOSIS — M23307 Other meniscus derangements, unspecified meniscus, left knee: Secondary | ICD-10-CM

## 2024-06-10 LAB — MICROALBUMIN / CREATININE URINE RATIO
Creatinine,U: 180.9 mg/dL
Microalb Creat Ratio: 8.4 mg/g (ref 0.0–30.0)
Microalb, Ur: 1.5 mg/dL (ref 0.0–1.9)

## 2024-06-10 MED ORDER — TRAZODONE HCL 50 MG PO TABS: 25.0000 mg | ORAL_TABLET | Freq: Every evening | ORAL | 1 refills | Status: AC | PRN

## 2024-06-10 NOTE — Progress Notes (Signed)
 Patient ID: Stanley Fernandez, male    DOB: [birth year]  Age: 53 y.o. MRN: [id number]  The patient is here for annual preventive examination and management of other chronic and acute problems.   The risk factors are reflected in the social history.   The roster of all physicians providing medical care to patient - is listed in the Snapshot section of the chart.   Activities of daily living:  The patient is 100% independent in all ADLs: dressing, toileting, feeding as well as independent mobility   Home safety : The patient has smoke detectors in the home. They wear seatbelts.  There are no unsecured firearms at home. There is no violence in the home.    There is no risks for hepatitis, STDs or HIV. There is no   history of blood transfusion. They have no travel history to infectious disease endemic areas of the world.   The patient has seen their dentist in the last six month. They have seen their eye doctor in the last year. The patinet  denies slight hearing difficulty with regard to whispered voices and some television programs.  They have deferred audiologic testing in the last year.  They do not  have excessive sun exposure. Discussed the need for sun protection: hats, long sleeves and use of sunscreen if there is significant sun exposure.    Diet: the importance of a healthy diet is discussed. They do have a healthy diet.   The benefits of regular aerobic exercise were discussed. The patient  exercises  3 to 5 days per week  for  60 minutes.    Depression screen: there are no signs or vegative symptoms of depression- irritability, change in appetite, anhedonia, sadness/tearfullness.   The following portions of the patient's history were reviewed and updated as appropriate: allergies, current medications, past family history, past medical history,  past surgical history, past social history  and problem list.   Visual acuity was not assessed per patient preference since the patient has  regular follow up with an  ophthalmologist. Hearing and body mass index were assessed and reviewed.    During the course of the visit the patient was educated and counseled about appropriate screening and preventive services including : fall prevention , diabetes screening, nutrition counseling, colorectal cancer screening, and recommended immunizations.    Chief Complaint:  HTN: home readings have been elevated to 154/82 in the evenings   Lymphedema,  legs painful by end of day . Left leg surgery .  Pain starts in  kneecap and radiates to to medial side,  feels like pinch with nails prior MCL /ACL repair,  peroneal nerve repair . Uses brace when exercising   Review of Symptoms  Patient denies headache, fevers, malaise, unintentional weight loss, skin rash, eye pain, sinus congestion and sinus pain, sore throat, dysphagia,  hemoptysis , cough, dyspnea, wheezing, chest pain, palpitations, orthopnea, edema, abdominal pain, nausea, melena, diarrhea, constipation, flank pain, dysuria, hematuria, urinary  Frequency, nocturia, numbness, tingling, seizures,  Focal weakness, Loss of consciousness,  Tremor, insomnia, depression, anxiety, and suicidal ideation.    Physical Exam:  BP 134/86   Pulse (!) 55   Temp (!) 97.3 F (36.3 C) (Oral)   Ht 5' 9 (1.753 m)   Wt 266 lb 12.8 oz (121 kg)   SpO2 98%   BMI 39.40 kg/m    Physical Exam Vitals reviewed.  Constitutional:      General: He is not in acute distress.  Appearance: Normal appearance. He is obese. He is not ill-appearing, toxic-appearing or diaphoretic.  HENT:     Head: Normocephalic and atraumatic.     Right Ear: Tympanic membrane, ear canal and external ear normal. There is no impacted cerumen.     Left Ear: Tympanic membrane, ear canal and external ear normal. There is no impacted cerumen.     Nose: Nose normal.     Mouth/Throat:     Mouth: Mucous membranes are moist.     Pharynx: Oropharynx is clear.  Eyes:     General: No  scleral icterus.       Right eye: No discharge.        Left eye: No discharge.     Conjunctiva/sclera: Conjunctivae normal.  Neck:     Thyroid : No thyromegaly.     Vascular: No carotid bruit or JVD.  Cardiovascular:     Rate and Rhythm: Normal rate and regular rhythm.     Heart sounds: Normal heart sounds.  Pulmonary:     Effort: Pulmonary effort is normal. No respiratory distress.     Breath sounds: Normal breath sounds.  Abdominal:     General: Bowel sounds are normal.     Palpations: Abdomen is soft. There is no mass.     Tenderness: There is no abdominal tenderness. There is no guarding or rebound.  Musculoskeletal:        General: Normal range of motion.     Cervical back: Normal range of motion and neck supple.  Lymphadenopathy:     Cervical: No cervical adenopathy.  Skin:    General: Skin is warm and dry.  Neurological:     General: No focal deficit present.     Mental Status: He is alert and oriented to person, place, and time. Mental status is at baseline.  Psychiatric:        Mood and Affect: Mood normal.        Behavior: Behavior normal.        Thought Content: Thought content normal.        Judgment: Judgment normal.     Assessment and Plan: Encounter for preventive health examination Assessment & Plan: age appropriate education and counseling updated, referrals for preventative services and immunizations addressed, dietary and smoking counseling addressed, most recent labs reviewed.  I have personally reviewed and have noted:   1) the patient's medical and social history 2) The pt's use of alcohol, tobacco, and illicit drugs 3) The patient's current medications and supplements 4) Functional ability including ADL's, fall risk, home safety risk, hearing and visual impairment 5) Diet and physical activities 6) Evidence for depression or mood disorder 7) The patient's height, weight, and BMI have been recorded in the chartrhythm.     I have made referrals, and  provided counseling and education based on review of the above    Colon cancer screening -     Cologuard  Left medial knee pain -     Ambulatory referral to Orthopedic Surgery  Other insomnia -     traZODone  HCl; Take 0.5-1 tablets (25-50 mg total) by mouth at bedtime as needed for sleep.  Dispense: 90 tablet; Refill: 1  Essential hypertension Assessment & Plan: he reports compliance with medication regimen  but has elevated readings at home and  today in office. Proteinuria screen negative .    Increase amlodipine  to 5 mg   Lab Results  Component Value Date   LABMICR See below: 04/19/2020   LABMICR See  below: 03/24/2020   MICROALBUR 1.5 06/10/2024      Lab Results  Component Value Date   NA 140 06/08/2024   K 4.7 06/08/2024   CL 106 06/08/2024   CO2 29 06/08/2024     Orders: -     Microalbumin / creatinine urine ratio  Lymphedema Assessment & Plan: Encouarged to use pumps more often.   but he has deferred because he is physically active during the day    Sleep apnea, obstructive Assessment & Plan: Diagnosed by sleep study. he is wearing her CPAP every night a minimum of 6 hours per night and notes improved daytime wakefulness and decreased fatigue     Hypertension associated with diabetes [facility]) Assessment & Plan: he reports compliance with medication regimen  but has elevated readings at home and  today in office. Proteinuria screen negative .    Increase amlodipine  to 5 mg   Lab Results  Component Value Date   LABMICR See below: 04/19/2020   LABMICR See below: 03/24/2020   MICROALBUR 1.5 06/10/2024      Lab Results  Component Value Date   NA 140 06/08/2024   K 4.7 06/08/2024   CL 106 06/08/2024   CO2 29 06/08/2024      Degeneration of meniscus of left knee Assessment & Plan: Suggested by current knee reports of giving way. Referral to [facility]    Morbid obesity [facility]) Assessment & Plan: I have  encouraged  him to resume exercising and  following  low glycemic index diet and regular exercise a minimum of 5 days per week.      Other orders -     amLODIPine  Besylate; Take 1 tablet (5 mg total) by mouth daily.  Dispense: 90 tablet; Refill: 1    Return in about 6 months (around 12/08/2024) for hypertension.  Verneita LITTIE Kettering, MD

## 2024-06-10 NOTE — Patient Instructions (Addendum)
 Your PSA,  A1C  , liver and kidney function are  all excellent    If you have protein in your urine today,, I will recommend adding losartan to your current regimen of amlodipine  for improved blood pressure control    I will initiate the cologuard order for your colon cancer screening  Test, You can take 3000 mg of acetominophen (tylenol ) every day safely  In divided doses (750 mg  every 6 hours  Or 1000 mg every 8 hours.)   Referral to [facility] is progress   Use your lymphedema pumps nightly after elevating legs for  at least 15 minutes   Please consider hepatitis B and pneumonia vaccines this year

## 2024-06-12 ENCOUNTER — Ambulatory Visit: Payer: Self-pay | Admitting: Internal Medicine

## 2024-06-12 DIAGNOSIS — M25562 Pain in left knee: Secondary | ICD-10-CM | POA: Insufficient documentation

## 2024-06-12 DIAGNOSIS — I10 Essential (primary) hypertension: Secondary | ICD-10-CM | POA: Insufficient documentation

## 2024-06-12 MED ORDER — AMLODIPINE BESYLATE 5 MG PO TABS: 5.0000 mg | ORAL_TABLET | Freq: Every day | ORAL | 1 refills | Status: AC

## 2024-06-12 NOTE — Assessment & Plan Note (Signed)
 Suggested by current knee reports of giving way. Referral to [facility]

## 2024-06-12 NOTE — Assessment & Plan Note (Signed)
 he reports compliance with medication regimen  but has elevated readings at home and  today in office. Proteinuria screen negative .    Increase amlodipine  to 5 mg   Lab Results  Component Value Date   LABMICR See below: 04/19/2020   LABMICR See below: 03/24/2020   MICROALBUR 1.5 06/10/2024      Lab Results  Component Value Date   NA 140 06/08/2024   K 4.7 06/08/2024   CL 106 06/08/2024   CO2 29 06/08/2024

## 2024-06-12 NOTE — Assessment & Plan Note (Signed)
Diagnosed by sleep study. he is wearing her CPAP every night a minimum of 6 hours per night and notes improved daytime wakefulness and decreased fatigue  

## 2024-06-12 NOTE — Assessment & Plan Note (Signed)

## 2024-06-12 NOTE — Assessment & Plan Note (Signed)
 Encouarged to use pumps more often.   but he has deferred because he is physically active during the day

## 2024-06-12 NOTE — Assessment & Plan Note (Signed)
 I have  encouraged  him to resume exercising and following  low glycemic index diet and regular exercise a minimum of 5 days per week.

## 2024-12-09 ENCOUNTER — Ambulatory Visit: Admitting: Internal Medicine
